# Patient Record
Sex: Female | Born: 1955 | Race: Black or African American | Hispanic: No | Marital: Married | Smoking: Never smoker
Health system: Southern US, Community
[De-identification: ages and names within clinical notes are randomized; demographics above are authoritative.]

## PROBLEM LIST (undated history)

## (undated) DIAGNOSIS — I890 Lymphedema, not elsewhere classified: Secondary | ICD-10-CM

## (undated) DIAGNOSIS — R1319 Other dysphagia: Secondary | ICD-10-CM

## (undated) DIAGNOSIS — E079 Disorder of thyroid, unspecified: Secondary | ICD-10-CM

## (undated) DIAGNOSIS — R1310 Dysphagia, unspecified: Secondary | ICD-10-CM

## (undated) HISTORY — DX: Disorder of thyroid, unspecified: E07.9

## (undated) HISTORY — DX: Other dysphagia: R13.19

## (undated) HISTORY — DX: Dysphagia, unspecified: R13.10

## (undated) HISTORY — DX: Lymphedema, not elsewhere classified: I89.0

---

## 1982-04-16 HISTORY — PX: ABDOMINAL HYSTERECTOMY: SHX81

## 2005-10-24 ENCOUNTER — Ambulatory Visit: Payer: Self-pay

## 2006-03-25 ENCOUNTER — Emergency Department: Payer: Self-pay | Admitting: Unknown Physician Specialty

## 2007-02-26 ENCOUNTER — Ambulatory Visit: Payer: Self-pay | Admitting: Internal Medicine

## 2008-03-08 ENCOUNTER — Ambulatory Visit: Payer: Self-pay | Admitting: Internal Medicine

## 2008-03-25 ENCOUNTER — Ambulatory Visit: Payer: Self-pay | Admitting: Internal Medicine

## 2008-04-10 ENCOUNTER — Emergency Department: Payer: Self-pay | Admitting: Emergency Medicine

## 2008-06-09 ENCOUNTER — Ambulatory Visit: Payer: Self-pay | Admitting: Internal Medicine

## 2008-06-11 ENCOUNTER — Ambulatory Visit: Payer: Self-pay | Admitting: Internal Medicine

## 2009-01-07 ENCOUNTER — Other Ambulatory Visit: Payer: Self-pay | Admitting: Internal Medicine

## 2009-03-08 ENCOUNTER — Other Ambulatory Visit: Payer: Self-pay | Admitting: Internal Medicine

## 2009-05-25 ENCOUNTER — Ambulatory Visit: Payer: Self-pay | Admitting: Internal Medicine

## 2009-06-07 ENCOUNTER — Other Ambulatory Visit: Payer: Self-pay | Admitting: Internal Medicine

## 2009-06-16 ENCOUNTER — Other Ambulatory Visit: Payer: Self-pay | Admitting: Internal Medicine

## 2009-12-02 ENCOUNTER — Other Ambulatory Visit: Payer: Self-pay | Admitting: Internal Medicine

## 2010-01-02 ENCOUNTER — Ambulatory Visit: Payer: Self-pay | Admitting: Internal Medicine

## 2010-01-31 ENCOUNTER — Other Ambulatory Visit: Payer: Self-pay | Admitting: Internal Medicine

## 2010-05-04 ENCOUNTER — Other Ambulatory Visit: Payer: Self-pay | Admitting: Internal Medicine

## 2010-08-20 LAB — HM MAMMOGRAPHY: HM Mammogram: NORMAL

## 2010-11-07 ENCOUNTER — Other Ambulatory Visit: Payer: Self-pay | Admitting: Internal Medicine

## 2011-01-15 LAB — TSH: TSH: 6.71 u[IU]/mL — AB (ref <=5.90)

## 2011-01-15 LAB — HEMOGLOBIN A1C: Hgb A1c MFr Bld: 6.1 % — AB (ref 4.0–6.0)

## 2011-01-22 LAB — LIPID PANEL: Cholesterol: 257 mg/dL — AB (ref 0–200)

## 2011-08-20 ENCOUNTER — Ambulatory Visit (INDEPENDENT_AMBULATORY_CARE_PROVIDER_SITE_OTHER): Payer: PRIVATE HEALTH INSURANCE | Admitting: Internal Medicine

## 2011-08-20 ENCOUNTER — Encounter: Payer: Self-pay | Admitting: Internal Medicine

## 2011-08-20 DIAGNOSIS — Z1239 Encounter for other screening for malignant neoplasm of breast: Secondary | ICD-10-CM

## 2011-08-20 DIAGNOSIS — I890 Lymphedema, not elsewhere classified: Secondary | ICD-10-CM | POA: Insufficient documentation

## 2011-08-20 DIAGNOSIS — R1314 Dysphagia, pharyngoesophageal phase: Secondary | ICD-10-CM

## 2011-08-20 DIAGNOSIS — R1310 Dysphagia, unspecified: Secondary | ICD-10-CM | POA: Insufficient documentation

## 2011-08-20 DIAGNOSIS — E042 Nontoxic multinodular goiter: Secondary | ICD-10-CM | POA: Insufficient documentation

## 2011-08-20 DIAGNOSIS — E785 Hyperlipidemia, unspecified: Secondary | ICD-10-CM

## 2011-08-20 NOTE — Patient Instructions (Signed)
Consider the Low Glycemic Index Diet and eat 6 smaller meals daily .  This boosts your metabolism and regulates your sugars:   7 AM Low carbohydrate Protein  Shakes (EAS Carb Control  Or Atkins ,  Available everywhere,   In  cases at [employer]Js )  2.5 carbs  (Add or substitute a toasted sandwhich thin w/ peanut butter)  10 AM: Protein bar by Atkins (snack size,  Chocolate lover's variety at  [employer])    Lunch: sandwich on pita bread or flatbread (Joseph's makes a pita bread and a flat bread , available at [employer] and [employer]; Toufayah makes a low carb flatbread available at [employer] and [employer]) Mission makes a low carb whole wheat tortilla available at [employer] most grocery stores   3 PM:  Mid day :  Another protein bar,  Or a  cheese stick, 1/4 cup of almonds, walnuts, pistachios, pecans, peanuts,  Macadamia nuts  6 PM  Dinner:  "mean and green:"  Meat/chicken/fish, salad, and green veggie : use ranch, vinagrette,  Blue cheese, etc  9 PM snack : Breyer's low carb fudgsicle or  ice cream bar (Carb Smart), or  Weight Watcher's ice cream bar , or another protein shake

## 2011-08-20 NOTE — Progress Notes (Signed)
Patient ID: Sandra Russo, female   DOB: February 28, 1956, 56 y.o.   MRN: [id number]  Patient Active Problem List  Diagnoses  . Acquired lymphedema of leg  . Dysphagia  . Multinodular goiter  . Esophageal dysphagia    Subjective:  CC:   Chief Complaint  Patient presents with  . Follow-up    HPI:   Sandra Russo a 56 y.o. female who presents for follow up on chronic conditions including dysphagia, hyperlipidemia, and palpitations.  She was last seen July 2012.  She has been having palpitations occasionally without presyncopal symptoms or shortness of breath.  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  It does not occur with exercise .  Her dysphagia has recurred for solid foods,  Helast Last esophageal dilation over 5 yrars ago.  She has had a recent evaluation by [facility] for lower leg edema secondary to lymphedema and has been ordered a lymphedema pump for right leg.    Oct labs hgba1c 6.1,  LDL 163, and tsh 6.710    Past Medical History  Diagnosis Date  . Acquired lymphedema of leg     right leg  . Dysphagia     treated with esophgeal dilation at [facility]  . multinodular goiter   . Esophageal dysphagia     History reviewed. No pertinent past surgical history.       The following portions of the patient's history were reviewed and updated as appropriate: Allergies, current medications, and problem list.    Review of Systems:   12 Pt  review of systems was negative except those addressed in the HPI,     History   Social History  . Marital Status: Married    Spouse Name: N/A    Number of Children: N/A  . Years of Education: N/A   Occupational History  . Not on file.   Social History Main Topics  . Smoking status: Never Smoker   . Smokeless tobacco: Never Used  . Alcohol Use: No  . Drug Use: No  . Sexually Active: Not on file   Other Topics Concern  . Not on file   Social History Narrative  . No narrative  on file    Objective:  BP 112/68  Pulse 75  Temp(Src) 97.8 F (36.6 C) (Oral)  Resp 14  Ht 5\' 6"  (1.676 m)  Wt 191 lb 12 oz (86.977 kg)  BMI 30.95 kg/m2  SpO2 98%  General appearance: alert, cooperative and appears stated age Ears: normal TM's and external ear canals both ears Throat: lips, mucosa, and tongue normal; teeth and gums normal Neck: no adenopathy, no carotid bruit, supple, symmetrical, trachea midline and thyroid not enlarged, symmetric, no tenderness/mass/nodules Back: symmetric, no curvature. ROM normal. No CVA tenderness. Lungs: clear to auscultation bilaterally Heart: regular rate and rhythm, S1, S2 normal, no murmur, click, rub or gallop Abdomen: soft, non-tender; bowel sounds normal; no masses,  no organomegaly Pulses: 2+ and symmetric Skin: Skin color, texture, turgor normal. No rashes or lesions Lymph nodes: Cervical, supraclavicular, and axillary nodes normal.  Assessment and Plan:  Multinodular goiter Her biopsy was reportedly normal, and her TSH was elevated in October.  Will repeat and treat if hypothyroid   Acquired lymphedema of leg To be managed with lymphedema pump.   Esophageal dysphagia She is experiencing dysphagia for solids , which is chronic   Will refer for EGD.     Updated Medication List No outpatient  encounter prescriptions on file as of 08/20/2011.     Orders Placed This Encounter  Procedures  . HM MAMMOGRAPHY  . MM Digital Screening  . HM PAP SMEAR  . Ambulatory referral to Gastroenterology  . HM COLONOSCOPY    No Follow-up on file.

## 2011-08-21 ENCOUNTER — Encounter: Payer: Self-pay | Admitting: Internal Medicine

## 2011-08-21 DIAGNOSIS — R1310 Dysphagia, unspecified: Secondary | ICD-10-CM | POA: Insufficient documentation

## 2011-08-21 DIAGNOSIS — E785 Hyperlipidemia, unspecified: Secondary | ICD-10-CM | POA: Insufficient documentation

## 2011-08-21 NOTE — Assessment & Plan Note (Signed)
Her biopsy was reportedly normal, and her TSH was elevated in October.  Will repeat and treat if hypothyroid

## 2011-08-21 NOTE — Assessment & Plan Note (Signed)
To be managed with lymphedema pump.

## 2011-08-21 NOTE — Assessment & Plan Note (Signed)
She is experiencing dysphagia for solids , which is chronic   Will refer for EGD.

## 2011-08-21 NOTE — Assessment & Plan Note (Signed)
Recent LDl was 162.   Will repeat with next blood draw and treat for LDL > 100.

## 2011-08-30 ENCOUNTER — Other Ambulatory Visit: Payer: Self-pay | Admitting: Internal Medicine

## 2011-08-30 LAB — TSH: Thyroid Stimulating Horm: 1.44 u[IU]/mL

## 2011-09-24 ENCOUNTER — Telehealth: Payer: Self-pay | Admitting: Internal Medicine

## 2011-09-24 NOTE — Telephone Encounter (Signed)
Patient wanting lab results from [facility] that she had done in May 2013.

## 2011-09-25 ENCOUNTER — Encounter: Payer: Self-pay | Admitting: Internal Medicine

## 2011-09-25 NOTE — Telephone Encounter (Signed)
Sent request to [facility] requesting labs.

## 2011-09-26 NOTE — Telephone Encounter (Signed)
We received the labs, they are in the green folder.  I will call when Dr. Darrick Huntsman has reviewed them.

## 2011-09-27 LAB — HM COLONOSCOPY

## 2011-10-03 ENCOUNTER — Ambulatory Visit: Payer: Self-pay | Admitting: Gastroenterology

## 2011-10-04 ENCOUNTER — Telehealth: Payer: Self-pay | Admitting: Internal Medicine

## 2011-10-04 NOTE — Telephone Encounter (Signed)
Patient called and stated she had labs done in May and has not heard the results yet.  Please advise.

## 2011-10-04 NOTE — Telephone Encounter (Signed)
All I received from [facility] was a hgba1c which was 6.2  This suggests that she has diet controlled diabetes.

## 2011-10-05 NOTE — Telephone Encounter (Signed)
I tried calling patient but she was not available at home or work.  I will try again later.

## 2011-10-08 ENCOUNTER — Ambulatory Visit: Payer: Self-pay | Admitting: Otolaryngology

## 2011-10-09 ENCOUNTER — Telehealth: Payer: Self-pay | Admitting: Internal Medicine

## 2011-10-09 NOTE — Telephone Encounter (Signed)
Pt canceled her mammogram   Sandra Russo 08/21/11

## 2011-10-09 NOTE — Telephone Encounter (Signed)
FYI

## 2011-10-10 NOTE — Telephone Encounter (Signed)
Patient notified

## 2011-11-22 ENCOUNTER — Encounter: Payer: Self-pay | Admitting: Internal Medicine

## 2012-01-15 HISTORY — PX: CHOLECYSTECTOMY: SHX55

## 2012-01-25 ENCOUNTER — Emergency Department: Payer: Self-pay | Admitting: Emergency Medicine

## 2012-01-25 LAB — COMPREHENSIVE METABOLIC PANEL
Albumin: 4.2 g/dL (ref 3.4–5.0)
Alkaline Phosphatase: 108 U/L (ref 50–136)
Anion Gap: 8 (ref 7–16)
BUN: 7 mg/dL (ref 7–18)
Chloride: 106 mmol/L (ref 98–107)
Co2: 28 mmol/L (ref 21–32)
Creatinine: 0.71 mg/dL (ref 0.60–1.30)
EGFR (Non-African Amer.): >60
Glucose: 142 mg/dL — ABNORMAL HIGH (ref 65–99)
Osmolality: 284 (ref 275–301)
Potassium: 4.2 mmol/L (ref 3.5–5.1)
SGPT (ALT): 26 U/L (ref 12–78)
Sodium: 142 mmol/L (ref 136–145)
Total Protein: 8.7 g/dL — ABNORMAL HIGH (ref 6.4–8.2)

## 2012-01-25 LAB — CBC
HCT: 39.1 % (ref 35.0–47.0)
HGB: 13 g/dL (ref 12.0–16.0)
MCH: 28.7 pg (ref 26.0–34.0)
MCHC: 33.3 g/dL (ref 32.0–36.0)
MCV: 86 fL (ref 80–100)

## 2012-01-25 LAB — URINALYSIS, COMPLETE
Bacteria: NONE SEEN
Bilirubin,UR: NEGATIVE
Leukocyte Esterase: NEGATIVE
Nitrite: NEGATIVE
Ph: 6 (ref 4.5–8.0)
Protein: NEGATIVE
RBC,UR: 1 /HPF (ref 0–5)
Specific Gravity: 1.011 (ref 1.003–1.030)
WBC UR: 1 /HPF (ref 0–5)

## 2012-01-28 ENCOUNTER — Telehealth: Payer: Self-pay | Admitting: Internal Medicine

## 2012-01-28 NOTE — Telephone Encounter (Signed)
Pt went to [facility] and was diagnosed with Gall Stones. Pt was given pain meds and nausea pills. She needs to discuss something's with Dr. Darrick Huntsman and nurse about FMLA papers and if she needs to be out of work before her surgery.

## 2012-01-28 NOTE — Telephone Encounter (Signed)
Caller: [name]/Patient; Patient Name: Sandra Russo; PCP: Duncan Dull (Adults only); Best Callback Phone Number: [phone]. Call regarding a FMLA form for Gallstones.  Pt was seen in ED on 01/25/12. Pt has an appt 02/06/12 with Dr. Michela Pitcher a surgeon. Pt taking Percocet for pain control and Zofran for nausea.  Pt wanting FMLA paper work filled out due to diagnosis.  Encouraged pt to have surgeon complete the paperwork. Pt concerned she may have another episode and have to miss work.  Pt not sure if she can sit for 12 hrs.  She continues to experience intermittment abdominal discomfort with mild - moderate pain.  All emergent symtpoms ruled out per Abdominal Pain Protocol with exception to 'All other situations' See Provider within 72 hrs'. Home care advice given.  No appt with PCP available 01/28/12.  Instructed pt to call scheduling for  future appt. Pt prefers a Thursday appt.

## 2012-01-30 ENCOUNTER — Emergency Department: Payer: Self-pay | Admitting: *Deleted

## 2012-01-30 LAB — CBC
HCT: 41.2 % (ref 35.0–47.0)
HGB: 13.6 g/dL (ref 12.0–16.0)
MCH: 28.5 pg (ref 26.0–34.0)
MCHC: 33 g/dL (ref 32.0–36.0)
RDW: 13.8 % (ref 11.5–14.5)

## 2012-01-30 LAB — COMPREHENSIVE METABOLIC PANEL
Albumin: 4.2 g/dL (ref 3.4–5.0)
Anion Gap: 8 (ref 7–16)
BUN: 7 mg/dL (ref 7–18)
Bilirubin,Total: 0.4 mg/dL (ref 0.2–1.0)
Co2: 28 mmol/L (ref 21–32)
Creatinine: 0.73 mg/dL (ref 0.60–1.30)
EGFR (African American): >60
EGFR (Non-African Amer.): >60
Glucose: 109 mg/dL — ABNORMAL HIGH (ref 65–99)
Osmolality: 276 (ref 275–301)
Total Protein: 9.1 g/dL — ABNORMAL HIGH (ref 6.4–8.2)

## 2012-01-31 ENCOUNTER — Ambulatory Visit: Payer: PRIVATE HEALTH INSURANCE | Admitting: Internal Medicine

## 2012-01-31 NOTE — Telephone Encounter (Signed)
Patient went back to ER last night after having another attack by her gallstones.  She isn't coming in today since she was told by ER to call surgeons office this a.m. and see if they could see her.  She said that she was trying to sit still so her gallbladder wouldn't act up again until she hears from them.

## 2012-02-04 ENCOUNTER — Ambulatory Visit: Payer: Self-pay | Admitting: Surgery

## 2012-04-14 ENCOUNTER — Ambulatory Visit: Payer: Self-pay | Admitting: Internal Medicine

## 2012-04-17 ENCOUNTER — Telehealth: Payer: Self-pay | Admitting: Internal Medicine

## 2012-04-17 DIAGNOSIS — R928 Other abnormal and inconclusive findings on diagnostic imaging of breast: Secondary | ICD-10-CM

## 2012-04-17 NOTE — Telephone Encounter (Signed)
Orders printed for diagnostic mammo and ultrasound. Please call [facility] to find out why they have not contacted patient yet.  Every other facility does.

## 2012-04-17 NOTE — Telephone Encounter (Signed)
I called patient to make sure that Sandra Russo had called her which she stated they had and she is aware of the appointment.

## 2012-04-17 NOTE — Telephone Encounter (Signed)
Her mammogram was incomplete  They need additional images of the left.  Has [facility] contacted her?

## 2012-04-17 NOTE — Telephone Encounter (Signed)
Called patient who stated that she has not received a call from [facility] yet.

## 2012-04-17 NOTE — Telephone Encounter (Signed)
I called and spoke with Selena Batten at [facility], she states that patient was contacted by Herbert Seta today around 3:43 patient is scheduled for 04/23/12 at 11:00.  Marchelle Folks is who follows up with patient at [facility].

## 2012-04-23 ENCOUNTER — Ambulatory Visit: Payer: Self-pay | Admitting: Internal Medicine

## 2012-04-23 LAB — HM MAMMOGRAPHY: HM Mammogram: NORMAL

## 2012-04-24 ENCOUNTER — Telehealth: Payer: Self-pay | Admitting: Internal Medicine

## 2012-04-24 NOTE — Telephone Encounter (Signed)
Additional views of the left breast were normal. Continued annual followup.

## 2012-04-25 NOTE — Telephone Encounter (Signed)
Pt notified of the results.  

## 2012-05-06 ENCOUNTER — Encounter: Payer: Self-pay | Admitting: Internal Medicine

## 2012-05-07 ENCOUNTER — Encounter: Payer: Self-pay | Admitting: Internal Medicine

## 2012-05-16 ENCOUNTER — Ambulatory Visit (INDEPENDENT_AMBULATORY_CARE_PROVIDER_SITE_OTHER): Payer: 59 | Admitting: Internal Medicine

## 2012-05-16 ENCOUNTER — Encounter: Payer: Self-pay | Admitting: Internal Medicine

## 2012-05-16 VITALS — BP 140/82 | HR 78 | Temp 97.9°F | Resp 17 | Wt 181.2 lb

## 2012-05-16 DIAGNOSIS — J189 Pneumonia, unspecified organism: Secondary | ICD-10-CM

## 2012-05-16 DIAGNOSIS — J1100 Influenza due to unidentified influenza virus with unspecified type of pneumonia: Secondary | ICD-10-CM

## 2012-05-16 LAB — POCT INFLUENZA A/B: Influenza B, POC: NEGATIVE

## 2012-05-16 MED ORDER — HYDROCODONE-HOMATROPINE 5-1.5 MG/5ML PO SYRP: 5.0000 mL | ORAL_SOLUTION | Freq: Four times a day (QID) | ORAL | Status: DC | PRN

## 2012-05-16 MED ORDER — PREDNISONE (PAK) 10 MG PO TABS: ORAL_TABLET | ORAL | Status: DC

## 2012-05-16 MED ORDER — ALBUTEROL SULFATE HFA 108 (90 BASE) MCG/ACT IN AERS: 2.0000 | INHALATION_SPRAY | Freq: Four times a day (QID) | RESPIRATORY_TRACT | Status: DC | PRN

## 2012-05-16 MED ORDER — ALBUTEROL SULFATE (2.5 MG/3ML) 0.083% IN NEBU
2.5000 mg | INHALATION_SOLUTION | Freq: Once | RESPIRATORY_TRACT | Status: AC
  Administered 2012-05-16: 2.5 mg via RESPIRATORY_TRACT

## 2012-05-16 MED ORDER — METHYLPREDNISOLONE ACETATE 40 MG/ML IJ SUSP
40.0000 mg | Freq: Once | INTRAMUSCULAR | Status: AC
  Administered 2012-05-16: 40 mg via INTRAMUSCULAR

## 2012-05-16 MED ORDER — LEVOFLOXACIN 500 MG PO TABS: 500.0000 mg | ORAL_TABLET | Freq: Every day | ORAL | Status: DC

## 2012-05-16 NOTE — Progress Notes (Signed)
Patient ID: Sandra Russo, female   DOB: 1955-06-10, 57 y.o.   MRN: [id number]   Patient Active Problem List  Diagnosis  . Acquired lymphedema of leg  . Dysphagia  . Multinodular goiter  . Esophageal dysphagia  . Other and unspecified hyperlipidemia  . Influenza and pneumonia    Subjective:  CC:   Chief Complaint  Patient presents with  . Cough    HPI:   Sandra Russo a 57 y.o. female who presents Fever chills,  Started a week ago  Felt like she had the flu .  Had the vaccine September.  Diarrhea started mon and tuesday but has resolved. ,  Now having a  productive cough  And bloody sinus drainage.  Feels lousy but went to work last night.  Was out Fri Mon and Tuesday .     Past Medical History  Diagnosis Date  . Acquired lymphedema of leg     right leg  . Dysphagia     treated with esophgeal dilation at [facility]  . multinodular goiter   . Esophageal dysphagia     Past Surgical History  Procedure Date  . Cholecystectomy Oct 2013    [facility]         The following portions of the patient's history were reviewed and updated as appropriate: Allergies, current medications, and problem list.    Review of Systems:   Patient denies headache, unintentional weight loss, skin rash, eye pain, sinus congestion and sinus pain, sore throat, dysphagia,  hemoptysis ,chest pain, palpitations, orthopnea, edema, abdominal pain, nausea, melena, diarrhea, constipation, flank pain, dysuria, hematuria, urinary  Frequency, nocturia, numbness, tingling, seizures,  Focal weakness, Loss of consciousness,  Tremor, insomnia, depression, anxiety, and suicidal ideation.         History   Social History  . Marital Status: Married    Spouse Name: N/A    Number of Children: N/A  . Years of Education: N/A   Occupational History  . Not on file.   Social History Main Topics  . Smoking status: Never Smoker   . Smokeless tobacco: Never Used  . Alcohol Use: No  . Drug Use: No  . Sexually  Active: Not on file   Other Topics Concern  . Not on file   Social History Narrative  . No narrative on file    Objective:  BP 140/82  Pulse 78  Temp 97.9 F (36.6 C) (Oral)  Resp 17  Wt 181 lb 4 oz (82.214 kg)  SpO2 96%  General appearance: alert, cooperative and appears stated age Ears: normal TM's and external ear canals both ears Throat: lips, mucosa, and tongue normal; teeth and gums normal Neck: no adenopathy, no carotid bruit, supple, symmetrical, trachea midline and thyroid not enlarged, symmetric, no tenderness/mass/nodules Back: symmetric, no curvature. ROM normal. No CVA tenderness. Lungs: abnormal, with ronchi, squeaks, increased fremitus  Wheezing  Heart: regular rate and rhythm, S1, S2 normal, no murmur, click, rub or gallop Abdomen: soft, non-tender; bowel sounds normal; no masses,  no organomegaly Pulses: 2+ and symmetric Skin: Skin color, texture, turgor normal. No rashes or lesions Lymph nodes: Cervical, supraclavicular, and axillary nodes normal.  Assessment and Plan:  Influenza and pneumonia By history, now with abnormal lung exam notable for ronchi, wheezing and increased tactile fremitus. .  Empiric treatment with prednisone  and levaquin, bronchodilators and cough suppressant for nocturnal use.  ,  cxr ordered.    Updated Medication List Outpatient Encounter Prescriptions as of 05/16/2012  Medication Sig  Dispense Refill  . albuterol (PROVENTIL HFA;VENTOLIN HFA) 108 (90 BASE) MCG/ACT inhaler Inhale 2 puffs into the lungs every 6 (six) hours as needed for wheezing.  1 Inhaler  0  . HYDROcodone-homatropine (HYCODAN) 5-1.5 MG/5ML syrup Take 5 mLs by mouth every 6 (six) hours as needed for cough.  180 mL  0  . levofloxacin (LEVAQUIN) 500 MG tablet Take 1 tablet (500 mg total) by mouth daily.  7 tablet  0  . predniSONE (STERAPRED UNI-PAK) 10 MG tablet 6 tablets on day 1, decrease by tablet daily until gone  21 tablet  0  . albuterol (PROVENTIL) (2.5 MG/3ML)  0.083% nebulizer solution 2.5 mg       . [EXPIRED] methylPREDNISolone acetate (DEPO-MEDROL) injection 40 mg          Orders Placed This Encounter  Procedures  . DG Chest 2 View  . HM MAMMOGRAPHY  . POCT Influenza A/B  . HM COLONOSCOPY    No Follow-up on file.

## 2012-05-16 NOTE — Patient Instructions (Addendum)
I am treating you for pneumonia and asthma. Please get a chest xray early next week if not today   Please start the levaquin tonight and the prednisone in the morning  Use the albuterol inhaler 2 puffs every 6 hours if needed for chest tightness or wheezing  The cough syrup has hydrocodone in it and may make you drowsy.  You can use Delsym for daytime cough

## 2012-05-18 ENCOUNTER — Encounter: Payer: Self-pay | Admitting: Internal Medicine

## 2012-05-18 DIAGNOSIS — J1100 Influenza due to unidentified influenza virus with unspecified type of pneumonia: Secondary | ICD-10-CM | POA: Insufficient documentation

## 2012-05-18 NOTE — Assessment & Plan Note (Addendum)
By history, now with abnormal lung exam notable for ronchi, wheezing and increased tactile fremitus. .  Empiric treatment with prednisone  and levaquin, bronchodilators and cough suppressant for nocturnal use.  ,  cxr ordered.

## 2012-05-19 ENCOUNTER — Ambulatory Visit: Payer: Self-pay | Admitting: Internal Medicine

## 2012-05-20 ENCOUNTER — Telehealth: Payer: Self-pay | Admitting: General Practice

## 2012-05-20 ENCOUNTER — Other Ambulatory Visit: Payer: Self-pay | Admitting: General Practice

## 2012-05-20 NOTE — Telephone Encounter (Signed)
Pt called stating that the levaquin prescribed to her is making her severely nauseaous. Please advise.

## 2012-05-20 NOTE — Telephone Encounter (Signed)
Called patient and she stated she did not get in 5 doses, she was only able to get in about 3. Not having the fever or chills but still having the discolored sputum and congestion. Therefore abx was called in to [facility]

## 2012-05-20 NOTE — Telephone Encounter (Signed)
If she got at least 5 doses in,  She probably won't need any additional antibiotics.  But if she didn't ,or if she is still having discolored sputum or fevers/chills ,  I will authoriize azithromycin 500 mg daily for 5 days

## 2012-05-21 ENCOUNTER — Telehealth: Payer: Self-pay | Admitting: Internal Medicine

## 2012-05-21 NOTE — Telephone Encounter (Signed)
Pt.notified

## 2012-05-21 NOTE — Telephone Encounter (Signed)
Chest x ray suggests she didn't take a deep breath but could also be a early pneumonia in RLL.  contineu abx

## 2012-05-22 ENCOUNTER — Telehealth: Payer: Self-pay | Admitting: General Practice

## 2012-05-22 NOTE — Telephone Encounter (Signed)
Pt called wanting to know if she can have a letter stating she is able to go back to work on Monday. Please advise.

## 2012-05-22 NOTE — Telephone Encounter (Signed)
letter on printer  

## 2012-05-31 ENCOUNTER — Other Ambulatory Visit: Payer: Self-pay

## 2012-06-09 ENCOUNTER — Encounter: Payer: Self-pay | Admitting: Internal Medicine

## 2012-09-17 ENCOUNTER — Ambulatory Visit (INDEPENDENT_AMBULATORY_CARE_PROVIDER_SITE_OTHER): Payer: 59 | Admitting: Adult Health

## 2012-09-17 ENCOUNTER — Encounter: Payer: Self-pay | Admitting: Adult Health

## 2012-09-17 VITALS — BP 110/62 | HR 65 | Temp 97.6°F | Resp 12 | Wt 182.5 lb

## 2012-09-17 DIAGNOSIS — R82998 Other abnormal findings in urine: Secondary | ICD-10-CM

## 2012-09-17 DIAGNOSIS — L748 Other eccrine sweat disorders: Secondary | ICD-10-CM

## 2012-09-17 DIAGNOSIS — L02439 Carbuncle of limb, unspecified: Secondary | ICD-10-CM

## 2012-09-17 DIAGNOSIS — R8290 Unspecified abnormal findings in urine: Secondary | ICD-10-CM | POA: Insufficient documentation

## 2012-09-17 DIAGNOSIS — L02429 Furuncle of limb, unspecified: Secondary | ICD-10-CM

## 2012-09-17 DIAGNOSIS — L750 Bromhidrosis: Secondary | ICD-10-CM

## 2012-09-17 LAB — POCT URINALYSIS DIPSTICK
Blood, UA: NEGATIVE
Glucose, UA: NEGATIVE
Nitrite, UA: POSITIVE
Protein, UA: NEGATIVE
Spec Grav, UA: 1.02
Urobilinogen, UA: 0.2

## 2012-09-17 MED ORDER — DOXYCYCLINE HYCLATE 100 MG PO TABS: 100.0000 mg | ORAL_TABLET | Freq: Two times a day (BID) | ORAL | Status: DC

## 2012-09-17 MED ORDER — CIPROFLOXACIN HCL 250 MG PO TABS: 250.0000 mg | ORAL_TABLET | Freq: Two times a day (BID) | ORAL | Status: DC

## 2012-09-17 NOTE — Assessment & Plan Note (Signed)
UA dipstick positive for leukocytes. Send urine for culture. Start Cipro 250 mg twice a day x3 days. RTC if symptoms are not improved within 3-4 days.

## 2012-09-17 NOTE — Progress Notes (Signed)
  Subjective:    Patient ID: Sandra Russo, female    DOB: 09/06/1955, 57 y.o.   MRN: [id number]  HPI  Pt presents to clinic with c/o strong odor in urine. Pt reports drinking plenty of fluids. She has been taking cranberry juice. She has also noticed "pressure". No discharge, itchiness or burning. She first noticed this strong odor ~ 1 week ago.  Patient has not consumed any new foods such as asparagus that would change the odor of the urine. 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 She reports sitting for extended periods of time with difficulty getting to the bathroom due to to lack of coverage.  She also presents with a boil under her right arm. She noticed it being sore ~ 1 week ago. She has applying Neosporin to the area. She reports slight tenderness to touch. However, if she leaves the area alone, she does not feel any discomfort. There is no drainage from the site. She denies any fever.   Current Outpatient Prescriptions on File Prior to Visit  Medication Sig Dispense Refill  . albuterol (PROVENTIL HFA;VENTOLIN HFA) 108 (90 BASE) MCG/ACT inhaler Inhale 2 puffs into the lungs every 6 (six) hours as needed for wheezing.  1 Inhaler  0   No current facility-administered medications on file prior to visit.     Review of Systems  Constitutional: Negative for fever and chills.  Genitourinary: Negative for dysuria, urgency, frequency and hematuria.       Strong odor to urine  Skin:       Boil under her right arm; tender to touch, no drainage      BP 110/62  Pulse 65  Temp(Src) 97.6 F (36.4 C) (Oral)  Resp 12  Wt 182 lb 8 oz (82.781 kg)  BMI 29.47 kg/m2  SpO2 99%    Objective:   Physical Exam  Constitutional: She is oriented to person, place, and time. She appears well-developed and well-nourished. No distress.  Cardiovascular: Normal rate.   Pulmonary/Chest: Effort normal. No respiratory distress.  Neurological: She is alert and  oriented to person, place, and time.  Skin: Skin is warm and dry.  Furuncle underneath right arm. Slight erythema. No drainage noted.  Psychiatric: She has a normal mood and affect. Her behavior is normal. Judgment and thought content normal.          Assessment & Plan:

## 2012-09-17 NOTE — Patient Instructions (Addendum)
  For your UTI:  Start Cipro twice daily for 3 days. Continue to drink plenty of water, fluids.  Empty your bladder when you have the urge. Do not hold for extended periods of time.   For your boil:  Continue to apply Neosporin to the affected area under your right arm.  If the area becomes larger, more uncomfortable or if you develop a fever please start the Doxycycline. If not, do not fill this medication.

## 2012-09-17 NOTE — Assessment & Plan Note (Signed)
Continue to apply Neosporin to the area. Doxycycline prescription provided in the event area becomes larger, more tender or if patient develops fever.

## 2013-02-19 ENCOUNTER — Other Ambulatory Visit: Payer: Self-pay

## 2013-04-23 ENCOUNTER — Ambulatory Visit: Payer: Self-pay | Admitting: Internal Medicine

## 2013-05-15 ENCOUNTER — Encounter: Payer: Self-pay | Admitting: Internal Medicine

## 2014-08-03 NOTE — Op Note (Signed)
Sandra Russo:  Sandra Russo, Sandra Russo MR#:  [id number] DATE OF BIRTH:  07-Aug-1955  DATE OF PROCEDURE:  02/04/2012  PREOPERATIVE DIAGNOSIS: Symptomatic cholelithiasis.   POSTOPERATIVE DIAGNOSIS: Acute cholecystitis.   PROCEDURE PERFORMED: Laparoscopic cholecystectomy.   SURGEON: Hyde Sires A. Tyreese Thain, MD  ANESTHESIA: General.   SPECIMENS: Gallbladder.   ESTIMATED BLOOD LOSS: 50 mL.   COMPLICATIONS: None.   INDICATION FOR SURGERY: Sandra Russo is a pleasant 59 year old female who presented last week with right upper quadrant pain which waxed and waned. She was noted to have gallstones on ultrasound and decision was made to undergo laparoscopic cholecystectomy.   DETAILS OF PROCEDURE: Appropriate consent was obtained. She was brought to the Operating Room suite and laid supine on the Operating Room table. Her abdomen was then prepped and draped in standard surgical fashion. A time out was then performed correctly identifying Sandra Russo, operative site and procedure to be performed. A transverse supraumbilical incision was made. This was deepened down to the fascia. the fascia was grasped. The fascia was incised. A finger was placed through the fascia and there were no adhesions to the underlying surface. 2-0 Vicryl stay sutures were placed through the fascia. Hassan trocar was placed in the abdomen. The abdomen was insufflated. The Sandra was rotated reverse Trendelenburg and to the left. The gallbladder was visualized. It was noted to be adherent to the surrounding omentum and have a bit of a rind on it. The epigastric 11 mm port was then placed under direct visualization and two 5 mm ports approximately 2 cm below the costal margin were placed at the anterior axillary line and the mid clavicular line. The gallbladder was grasped by the fundus. It was retracted over the dome of the liver. Adhesions were taken down off the gallbladder. The cystic artery and cystic duct were then dissected out. Critical  view was obtained. The gallbladder was then taken off the gallbladder fossa. 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 I did apply electrocautery and also Surgicel and at the end of the case this was noted to be hemostatic. Gallbladder was then taken off the gallbladder fossa. There was a poor plane but it came out. There was a little bit of posterior wall which remained attached to the fossa which was cauterized to prevent a biloma. Gallbladder was then taken out with a endocatch bag through the supraumbilical port. The abdomen was then irrigated. The gallbladder fossa was examined. It was hemostatic. The trocars were then removed under direct visualization. The supraumbilical trocar was closed using a figure-of-eight 0 Vicryl as well as tying the previously placed stay sutures together. The skin was closed using interrupted 4-0 Monocryl dermals. Dermabond was then placed over the wounds. Sandra was then awoken, extubated and brought to postanesthesia care unit. There were no immediate complications. Needle, sponge, and instrument counts correct at the end of the procedure.    ____________________________ Si Raiderhristopher A. Amol Domanski, MD cal:cms D: 02/04/2012 15:31:24 ET T: 02/04/2012 15:59:29 ET JOB#: [id number]  cc: Cristal Deerhristopher A. Pretty Weltman, MD, <Dictator> Jarvis NewcomerHRISTOPHER A Rianne Degraaf MD ELECTRONICALLY SIGNED 02/05/2012 9:05

## 2014-08-30 ENCOUNTER — Encounter: Payer: Self-pay | Admitting: Internal Medicine

## 2014-08-30 ENCOUNTER — Ambulatory Visit
Admission: RE | Admit: 2014-08-30 | Discharge: 2014-08-30 | Disposition: A | Discharge status: Home / Self Care | Payer: 59 | Source: Ambulatory Visit | Attending: Internal Medicine | Admitting: Internal Medicine

## 2014-08-30 ENCOUNTER — Ambulatory Visit (INDEPENDENT_AMBULATORY_CARE_PROVIDER_SITE_OTHER): Payer: 59 | Admitting: Internal Medicine

## 2014-08-30 VITALS — BP 130/80 | HR 77 | Temp 98.1°F | Resp 14 | Ht 64.0 in | Wt 195.5 lb

## 2014-08-30 DIAGNOSIS — R0789 Other chest pain: Secondary | ICD-10-CM | POA: Insufficient documentation

## 2014-08-30 DIAGNOSIS — E042 Nontoxic multinodular goiter: Secondary | ICD-10-CM

## 2014-08-30 DIAGNOSIS — K589 Irritable bowel syndrome without diarrhea: Secondary | ICD-10-CM

## 2014-08-30 DIAGNOSIS — R079 Chest pain, unspecified: Secondary | ICD-10-CM | POA: Insufficient documentation

## 2014-08-30 DIAGNOSIS — E785 Hyperlipidemia, unspecified: Secondary | ICD-10-CM

## 2014-08-30 DIAGNOSIS — R1314 Dysphagia, pharyngoesophageal phase: Secondary | ICD-10-CM

## 2014-08-30 DIAGNOSIS — R1012 Left upper quadrant pain: Secondary | ICD-10-CM

## 2014-08-30 DIAGNOSIS — E669 Obesity, unspecified: Secondary | ICD-10-CM

## 2014-08-30 DIAGNOSIS — R1310 Dysphagia, unspecified: Secondary | ICD-10-CM

## 2014-08-30 DIAGNOSIS — R1319 Other dysphagia: Secondary | ICD-10-CM

## 2014-08-30 LAB — COMPREHENSIVE METABOLIC PANEL
ALK PHOS: 86 U/L (ref 39–117)
ALT: 16 U/L (ref 0–35)
AST: 16 U/L (ref 0–37)
Albumin: 3.8 g/dL (ref 3.5–5.2)
BUN: 8 mg/dL (ref 6–23)
CO2: 30 mEq/L (ref 19–32)
CREATININE: 0.84 mg/dL (ref 0.40–1.20)
Calcium: 9.6 mg/dL (ref 8.4–10.5)
Chloride: 106 mEq/L (ref 96–112)
GFR: 89.39 mL/min (ref >60.00)
Glucose, Bld: 100 mg/dL — ABNORMAL HIGH (ref 70–99)
POTASSIUM: 3.8 meq/L (ref 3.5–5.1)
Sodium: 139 mEq/L (ref 135–145)
Total Bilirubin: 0.4 mg/dL (ref 0.2–1.2)
Total Protein: 7.2 g/dL (ref 6.0–8.3)

## 2014-08-30 LAB — LIPID PANEL
CHOLESTEROL: 248 mg/dL — AB (ref 0–200)
HDL: 57.4 mg/dL (ref >39.00)
LDL Cholesterol: 170 mg/dL — ABNORMAL HIGH (ref 0–99)
NonHDL: 190.6
TRIGLYCERIDES: 104 mg/dL (ref 0.0–149.0)
Total CHOL/HDL Ratio: 4
VLDL: 20.8 mg/dL (ref 0.0–40.0)

## 2014-08-30 LAB — CBC WITH DIFFERENTIAL/PLATELET
BASOS ABS: 0 10*3/uL (ref 0.0–0.1)
Basophils Relative: 0.3 % (ref 0.0–3.0)
EOS ABS: 0.1 10*3/uL (ref 0.0–0.7)
Eosinophils Relative: 2.1 % (ref 0.0–5.0)
HEMATOCRIT: 38.5 % (ref 36.0–46.0)
Hemoglobin: 12.9 g/dL (ref 12.0–15.0)
LYMPHS ABS: 2.8 10*3/uL (ref 0.7–4.0)
Lymphocytes Relative: 39.7 % (ref 12.0–46.0)
MCHC: 33.6 g/dL (ref 30.0–36.0)
MCV: 84.8 fl (ref 78.0–100.0)
MONOS PCT: 5.6 % (ref 3.0–12.0)
Monocytes Absolute: 0.4 10*3/uL (ref 0.1–1.0)
NEUTROS ABS: 3.7 10*3/uL (ref 1.4–7.7)
Neutrophils Relative %: 52.3 % (ref 43.0–77.0)
Platelets: 325 10*3/uL (ref 150.0–400.0)
RBC: 4.54 Mil/uL (ref 3.87–5.11)
RDW: 14.3 % (ref 11.5–15.5)
WBC: 7.1 10*3/uL (ref 4.0–10.5)

## 2014-08-30 LAB — LIPASE: Lipase: 25 U/L (ref 11.0–59.0)

## 2014-08-30 LAB — TSH: TSH: 2.31 u[IU]/mL (ref 0.35–4.50)

## 2014-08-30 NOTE — Patient Instructions (Signed)
We are postponing your annual wellness exam due to your acute problems.  I have ordered a chest x ray to evaluate your aorta and heart .  Please get this done at [facility] .  This is Dr. Melina Schoolsullo's version of a  "Low GI"  Weight loss Diet.  It is appropriate for all patients with normal renal function , gluten tolerance, and advised for patients who have prediabetes or diabetes:   All of the foods can be found at grocery stores and in bulk at [employer].  The Atkins protein bars and shakes are available in more varieties at Target, WalMart and [employer]'s [employer].     7 AM Breakfast:  Choose from the following:  < 5 carbs  Weekdays: Low carbohydrate Protein  Shakes (EAS AdvantEdge "Carb  Control" shakes, Atkins,  Muscle Milk or Premier Protein shakes)     Weekends:  a scrambled egg/bacon/cheese burrito made with Mission's "carb  balance" whole wheat tortilla  (about 10 net carbs )  Eggs,  bacon /sausage , Joseph's pita /lavash bread or  (5 carbs)  A slice of fritatta ( egg based baked dish, no  crust:  google it) (< 10 carbs)   Avoid cereal and bananas, oatmeal and cream of wheat and grits. They are loaded with carbohydrates!   10 AM: high protein snack  (< 5 carbs)   Protein bar by Atkins  Or KIND  (the snack size, < 200 cal, usually < 6 carbs    A stick of cheese:  Around 1 carb,  100 cal      Other so called "protein bars" tend to be loaded with carbohydrates.  Remember, in food advertising, the word "energy" is synonymous for " carbohydrate."  Lunch:   A Sandwich using the bread choices listed, Can use any  Eggs,  lunchmeat, grilled meat or canned tuna).  Can add avocado, regular mayo/mustard  and cheese.  A Salad using blue cheese, ranch,  Goddess dressing  or vinagrette,  No croutons or "confetti" and no "candied nuts" but regular nuts OK.   2 HARD BOILED EGG WHITES AND A CUP OF one of these greek yogurts:    dannon lt n fit greek yogurt         chobani 100 greek yogurt,    Oikos triple zero greek  yogurt       No pretzels or chips.  Pickles and miniature sweet peppers are a good low  carb alternative that provide a "crunch"  The bread is the only source of carbohydrate in a sandwich and  can be decreased by trying some of these alternatives to traditional loaf bread:   Joseph's pita bread and Lavash (flat) bread :  50 cal and 4 net carbs  available at BJs and WalMart.  Taste better when toasted, use as pita chips  Toufayan makes a variety of  flatbreads and  A PITA POCKET.    LOOK FOR  THE ONES THAT ARE 17 NET CARBS OR LESS    Mission makes 2 sizes of  Low carb whole wheat tortillas  (The large one is  210 cal and 6 net carbs)   Avoid "Low fat dressings, as well as Reyne Dumas[place]ina and [address] dressings    3 PM/ Mid day  Snack:  Consider  1 ounce of  almonds, walnuts, pistachios, pecans, peanuts,  Macadamia nuts or a nut medley that does not contain raisins or cranberries.  No "granola"; the dried cranberries and raisins are loaded  with carbohydrates. Mixed nuts as long as there are no raisins,  cranberries or dried fruit.    Try the prosciutto/mozzarella cheese sticks by [employer]  In deli /backery section   High protein   To avoid overindulging in snacks: Try drinking a glass of unsweeted almond/coconut milk  Or a cup of coffee with your Atkins chocolate bar to keep you from having 3!!!   Pork rinds!  Yes Pork Rinds are low carb potato chip substitute!   Toasted Joseph's flatbread with hummous dip (chickpeas)       6 PM  Dinner:     Meat/fowl/fish with a green salad, and either broccoli, cauliflower, green beans, spinach, brussel sprouts, bok choy or  Lima beans. Fried in canola oil /olive oil BUT DO NOT BREAD THE PROTEIN!!      There is a low carb pasta by Dreamfield's that is acceptable and tastes great: only 5 digestible carbs/serving.( All grocery stores but BJs carry it )  Prepared Meals:  Try Kai LevinsMichel Angelo's chicken piccata or chicken or eggplant parm over low carb pasta.(Lowes  and BJs)   Clifton CustardAaron Sanchez's "Carnitas" (pulled pork, no sauce,  0 carbs) or his beef pot roast to make a dinner burrito (at [employer])  Barbecue with cole slaw is low carb BUT NO BUN!  SAME WITH HAMBURGERS     Whole wheat pasta is still full of digestible carbs and  Not as low in glycemic index as Dreamfield's.   Brown rice is still rice,  So skip the rice and noodles if you eat Congohinese or [place] (or at least limit to 1/2 cup)  9 PM snack :   Breyer's "low carb" fudgsicle or  ice cream bar (Carb Smart line), or  Weight  Watcher's ice cream bar , or another "no sugar added" ice cream;  a serving of fresh berries/cherries with whipped cream   Cheese or greek yogurt   8 ounces of [place] Diamond unsweetened almond/cococunut milk  Cheese and crackers (using WASA crackers,  They are low carb) or peanut butter on low carb crackers or pita bread     Avoid bananas, pineapple, grapes  and watermelon on a regular basis because they are high in sugar.  THINK OF THEM AS DESSERT and do not have daily   Remember that snack Substitutions should be less than 10 NET carbs per serving and meals should be < 20 net carbs. Remember that carbohydrates from fiber do not affect blood sugar, so you can  subtract fiber grams to get the "net carbs " of any particular food item.

## 2014-08-30 NOTE — Progress Notes (Signed)
Patient ID: Sandra Russo, female   DOB: [birth year], 59 y.o.   MRN: [id number]  Patient Active Problem List   Diagnosis Date Noted  . IBS (irritable colon syndrome) 08/31/2014  . Obesity 08/30/2014  . Chest pain 08/30/2014  . Hyperlipidemia with target LDL less than 160 08/21/2011  . Esophageal dysphagia   . Multinodular goiter 08/20/2011  . Acquired lymphedema of leg     Subjective:  CC:   Chief Complaint  Patient presents with  . Annual Exam    HPI:   Sandra Russo is a 59 y.o. female who presents for    Annual exam, but patient has not been seen since Feb 2014 and has several medical issues to discuss. So annual wellness Exam postponed   1)_ Pain when she presses on her LUQ or leans over a counter and presses on it,  Pain shoots to right arm and is severe, to the bone, happens every time she leans over onto left upper quadtrant,  Has been occurring for nearly a year,  And the pain has become more severe.  Exam of left arm today is normal  2) Had an episode of severe chest pain and irregular heat beat lasting two hours 3 weeks ago .  Occurred when she got up suddenly from sleeping after being up for more than 24 hours. Pain radiated to her left  jaw , was not accompanied by nausea or shortness of breath.  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  Works 3 shifts per week,  Head feels foggy in the morning when she first wakes, up,  Takes an hour to two to clear head.  Not sure if she snores, Wakes up groggy and tired too,  Not sure if she snores,  Husband snores. Marland Kitchen      6) Weight gain  :  No regular exercise,  Has tried cutting back  on food,  Thinks it may be constipation, sinc she has it along with bloating   Her bowel evacuation pattern is to have several days of constipation followed by a prolonged defecation period, after using an organic vinegar  That she drinks  And works in several hours,  Does this weekly.  History of IBS,  Last colonoscopy attempted in 2013 by Niel Hummer but reports says prep was poor so incomplete.  Wt Readings from Last 3 Encounters:  08/30/14 195 lb 8 oz (88.678 kg)  09/17/12 182 lb 8 oz (82.781 kg)  05/16/12 181 lb 4 oz (82.214 kg)    . 7) Dysphagia, chronic, History of esophageal stricture..  Prior dilations  Last one done and was normal in 2013.  Not currently  using a PPI,  Still feels like she has a lump in her throat, which was investigated  Two year ago with a normal exam by ENT .  Barium swallow was ordered but never done .      Past Medical History  Diagnosis Date  . Acquired lymphedema of leg     right leg  . Dysphagia     treated with esophgeal dilation at [facility]  .  multinodular goiter   . Esophageal dysphagia     Past Surgical History  Procedure Laterality Date  . Cholecystectomy  Oct 2013    [facility]  . Abdominal hysterectomy  1984       The following portions of the patient's history were reviewed and updated as appropriate: Allergies, current medications, and problem list.    Review of Systems:   Patient denies headache, fevers, malaise, unintentional weight loss, skin rash, eye pain, sinus congestion and sinus pain, sore throat, dysphagia,  hemoptysis , cough, dyspnea, wheezing, chest pain, palpitations, orthopnea, edema, abdominal pain, nausea, melena, diarrhea, constipation, flank pain, dysuria, hematuria, urinary  Frequency, nocturia, numbness, tingling, seizures,  Focal weakness, Loss of consciousness,  Tremor, insomnia, depression, anxiety, and suicidal ideation.     History   Social History  . Marital Status: Married    Spouse Name: N/A  . Number of  Children: N/A  . Years of Education: N/A   Occupational History  . Not on file.   Social History Main Topics  . Smoking status: Never Smoker   . Smokeless tobacco: Never Used  . Alcohol Use: No  . Drug Use: No  . Sexual Activity: Not on file   Other Topics Concern  . Not on file   Social History Narrative    Objective:  Filed Vitals:   08/30/14 1326  BP: 130/80  Pulse: 77  Temp: 98.1 F (36.7 C)  Resp: 14     General appearance: alert, cooperative and appears stated age Ears: normal TM's and external ear canals both ears Throat: lips, mucosa, and tongue normal; teeth and gums normal Neck: no adenopathy, no carotid bruit, supple, symmetrical, trachea midline and thyroid not enlarged, symmetric, no tenderness/mass/nodules Back: symmetric, no curvature. ROM normal. No CVA tenderness. Lungs: clear to auscultation bilaterally Heart: regular rate and rhythm, S1, S2 normal, no murmur, click, rub or gallop Abdomen: soft, non-tender; bowel sounds normal; no masses,  no organomegaly Pulses: 2+ and symmetric Skin: Skin color, texture, turgor normal. No rashes or lesions Lymph nodes: Cervical, supraclavicular, and axillary nodes normal.  Assessment and Plan:  Hyperlipidemia with target LDL less than 160  Based on  current lipid profile, the risk of clinically significant CAD is 8% over the next 10 years, using the Framingham risk calculator. 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  EKG is normal today  And Her chest x ray is clear .  Given that there are no  signs of prior MRI or arrhythmia on EKG done in office No  further workup at this time per patient preference.   Advised to resume PPI and stop drinking [facility].    Dysphagia She continues to report a  is experiencing dysphagia for solids , which is chronic and has been worked up with both EGD and ENT evaluation .  Sh ehas not had any choking or regurgitation,  More of a globus sensation.     Obesity She has gained 14 lbs in the last 2 years and Body mass index is 33.54 kg/(m^2).   I have addressed  BMI and recommended a low glycemic index diet utilizing smaller  more frequent meals to increase metabolism.  I have also recommended that patient start exercising with a goal of 30 minutes of aerobic exercise a minimum of 5 days per week. Screening for lipid disorders, thyroid and diabetes were  done today.      Esophageal dysphagia She is experiencing dysphagia for solids , which is chronic  .  No choking or regurgitation,  Normal EGD in 2013 for same.  Consider symptoms more of a globus sensation,  No further  workup at this time      Multinodular goiter Thyroid function is WNL and her current globus symptoms are unchanged.      IBS (irritable colon syndrome) Trial of linzess recommended.    A total of 40 minutes was spent with patient more than half of which was spent in counseling patient on the above mentioned issues , reviewing and explaining recent labs and imaging studies done, and coordination of care.  Updated Medication List Outpatient Encounter Prescriptions as of 08/30/2014  Medication Sig  . Linaclotide (LINZESS) 145 MCG CAPS capsule Take 1 capsule (145 mcg total) by mouth daily.  . [DISCONTINUED] albuterol (PROVENTIL HFA;VENTOLIN HFA) 108 (90 BASE) MCG/ACT inhaler Inhale 2 puffs into the lungs every 6 (six) hours as needed for wheezing.  . [DISCONTINUED] ciprofloxacin (CIPRO) 250 MG tablet Take 1 tablet (250 mg total) by mouth 2 (two) times daily.  . [DISCONTINUED] doxycycline (VIBRA-TABS) 100 MG tablet Take 1 tablet (100  mg total) by mouth 2 (two) times daily.   No facility-administered encounter medications on file as of 08/30/2014.     Orders Placed This Encounter  Procedures  . DG Chest 2 View  . CBC with Differential/Platelet  . Lipid panel  . Comprehensive metabolic panel  . Lipase  . TSH  . EKG 12-Lead  . EKG 12-Lead    Return in about 1 month (around 09/30/2014).

## 2014-08-30 NOTE — Progress Notes (Signed)
Pre-visit discussion using our clinic review tool. No additional management support is needed unless otherwise documented below in the visit note.  

## 2014-08-31 ENCOUNTER — Encounter: Payer: Self-pay | Admitting: Internal Medicine

## 2014-08-31 DIAGNOSIS — K589 Irritable bowel syndrome without diarrhea: Secondary | ICD-10-CM | POA: Insufficient documentation

## 2014-08-31 DIAGNOSIS — R072 Precordial pain: Secondary | ICD-10-CM

## 2014-08-31 MED ORDER — LINACLOTIDE 145 MCG PO CAPS
145.0000 ug | ORAL_CAPSULE | Freq: Every day | ORAL | Status: DC
Start: 2014-08-31 — End: 2017-08-15

## 2014-08-31 NOTE — Assessment & Plan Note (Addendum)
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  EKG is normal today  And Her chest x ray is clear .  Given that there are no  signs of prior MRI or arrhythmia on EKG done in office No further workup at this time per patient preference.   Advised to resume PPI and stop drinking [facility].

## 2014-08-31 NOTE — Assessment & Plan Note (Signed)
She is experiencing dysphagia for solids , which is chronic  .  No choking or regurgitation,  Normal EGD in 2013 for same.  Consider symptoms more of a globus sensation,  No further  workup at this time

## 2014-08-31 NOTE — Assessment & Plan Note (Signed)
Trial of linzess recommended.

## 2014-08-31 NOTE — Assessment & Plan Note (Signed)
Thyroid function is WNL and her current globus symptoms are unchanged.

## 2014-08-31 NOTE — Assessment & Plan Note (Signed)
She has gained 14 lbs in the last 2 years and Body mass index is 33.54 kg/(m^2).   I have addressed  BMI and recommended a low glycemic index diet utilizing smaller more frequent meals to increase metabolism.  I have also recommended that patient start exercising with a goal of 30 minutes of aerobic exercise a minimum of 5 days per week. Screening for lipid disorders, thyroid and diabetes were  done today.

## 2014-08-31 NOTE — Assessment & Plan Note (Signed)
  Based on  current lipid profile, the risk of clinically significant CAD is 8% over the next 10 years, using the Framingham risk calculator. The [employer] of [employer]iology recommends starting patients aged 59 or higher on moderate intensity statin therapy for LDL between 70-189 and 10 yr risk of CAD > 7.5% ;  So I am recommending statin therapy with simvastatin

## 2014-08-31 NOTE — Assessment & Plan Note (Addendum)
She continues to report a  is experiencing dysphagia for solids , which is chronic and has been worked up with both EGD and ENT evaluation .  Sh ehas not had any choking or regurgitation,  More of a globus sensation.

## 2014-09-01 NOTE — Telephone Encounter (Signed)
FYI

## 2014-09-10 ENCOUNTER — Encounter: Payer: 59 | Admitting: Internal Medicine

## 2014-10-04 ENCOUNTER — Encounter: Payer: Self-pay | Admitting: Internal Medicine

## 2014-10-04 ENCOUNTER — Other Ambulatory Visit (HOSPITAL_COMMUNITY)
Admission: RE | Admit: 2014-10-04 | Discharge: 2014-10-04 | Disposition: A | Discharge status: Home / Self Care | Payer: 59 | Source: Ambulatory Visit | Attending: Internal Medicine | Admitting: Internal Medicine

## 2014-10-04 ENCOUNTER — Ambulatory Visit (INDEPENDENT_AMBULATORY_CARE_PROVIDER_SITE_OTHER): Payer: 59 | Admitting: Internal Medicine

## 2014-10-04 VITALS — BP 114/78 | HR 83 | Temp 97.8°F | Resp 14 | Ht 64.0 in | Wt 195.5 lb

## 2014-10-04 DIAGNOSIS — Z1151 Encounter for screening for human papillomavirus (HPV): Secondary | ICD-10-CM | POA: Insufficient documentation

## 2014-10-04 DIAGNOSIS — Z79899 Other long term (current) drug therapy: Secondary | ICD-10-CM

## 2014-10-04 DIAGNOSIS — E669 Obesity, unspecified: Secondary | ICD-10-CM

## 2014-10-04 DIAGNOSIS — Z90710 Acquired absence of both cervix and uterus: Secondary | ICD-10-CM | POA: Diagnosis not present

## 2014-10-04 DIAGNOSIS — Z1239 Encounter for other screening for malignant neoplasm of breast: Secondary | ICD-10-CM

## 2014-10-04 DIAGNOSIS — Z9889 Other specified postprocedural states: Secondary | ICD-10-CM

## 2014-10-04 DIAGNOSIS — Z124 Encounter for screening for malignant neoplasm of cervix: Secondary | ICD-10-CM

## 2014-10-04 DIAGNOSIS — E785 Hyperlipidemia, unspecified: Secondary | ICD-10-CM

## 2014-10-04 DIAGNOSIS — Z0000 Encounter for general adult medical examination without abnormal findings: Secondary | ICD-10-CM | POA: Diagnosis not present

## 2014-10-04 DIAGNOSIS — M5010 Cervical disc disorder with radiculopathy, unspecified cervical region: Secondary | ICD-10-CM

## 2014-10-04 DIAGNOSIS — Z01419 Encounter for gynecological examination (general) (routine) without abnormal findings: Secondary | ICD-10-CM | POA: Insufficient documentation

## 2014-10-04 DIAGNOSIS — Z9049 Acquired absence of other specified parts of digestive tract: Secondary | ICD-10-CM

## 2014-10-04 MED ORDER — SIMVASTATIN 20 MG PO TABS: 20.0000 mg | ORAL_TABLET | Freq: Every day | ORAL | Status: DC

## 2014-10-04 NOTE — Progress Notes (Signed)
Patient ID: Sandra Russo, female    DOB: [birth year]  Age: 59 y.o. MRN: [id number]  The patient is here for annual  wellness examination and management of other chronic and acute problems.    The risk factors are reflected in the social history.  The roster of all physicians providing medical care to patient - is listed in the Snapshot section of the chart.  Activities of daily living:  The patient is 100% independent in all ADLs: dressing, toileting, feeding as well as independent mobility  Home safety : The patient has smoke detectors in the home. They wear seatbelts.  There are no firearms at home. There is no violence in the home.   There is no risks for hepatitis, STDs or HIV. There is no   history of blood transfusion. They have no travel history to infectious disease endemic areas of the world.  The patient has seen their dentist in the last six month. They have seen their eye doctor in the last year. They admit to slight hearing difficulty with regard to whispered voices and some television programs.  They have deferred audiologic testing in the last year.  They do not  have excessive sun exposure. Discussed the need for sun protection: hats, long sleeves and use of sunscreen if there is significant sun exposure.   Diet: the importance of a healthy diet is discussed. They do have a healthy diet.  The benefits of regular aerobic exercise were discussed. She walks 4 times per week ,  20 minutes.   Depression screen: there are no signs or vegative symptoms of depression- irritability, change in appetite, anhedonia, sadness/tearfullness.  Cognitive assessment: the patient manages all their financial and personal affairs and is actively engaged. They could relate day,date,year and events; recalled 2/3 objects at 3 minutes; performed clock-face test normally.  The following portions of the patient's history were reviewed and updated as appropriate: allergies, current medications, past  family history, past medical history,  past surgical history, past social history  and problem list.  Visual acuity was not assessed per patient preference since she has regular follow up with her ophthalmologist. Hearing and body mass index were assessed and reviewed.   During the course of the visit the patient was educated and counseled about appropriate screening and preventive services including : fall prevention , diabetes screening, nutrition counseling, colorectal cancer screening, and recommended immunizations.    CC:   Weight gain. She is not exercising ,  Not following a diet.     Chest pain :Has not had any more chest pain ,  Scheduled to see Dr Lorenz Coaster in July   LUQ pain:  seh continues to have  LUQ pain with bending over to tie shoes or bending over a counter, .  For the past year, episodic, doesn't happen every tine. The pain radiates to her left elbow and is deep and aching.      History Talicia has a past medical history of Acquired lymphedema of leg; Dysphagia; multinodular goiter; and Esophageal dysphagia.   She has past surgical history that includes Cholecystectomy (Oct 2013) and Abdominal hysterectomy (1984).   Her family history includes Hypertension in her father and mother.She reports that she has never smoked. She has never used smokeless tobacco. She reports that she does not drink alcohol or use illicit drugs.  Outpatient Prescriptions Prior to Visit  Medication Sig Dispense Refill  . Linaclotide (LINZESS) 145 MCG CAPS capsule Take 1 capsule (145 mcg total) by mouth daily.  30 capsule 3   No facility-administered medications prior to visit.    Review of Systems   Patient denies headache, fevers, malaise, unintentional weight loss, skin rash, eye pain, sinus congestion and sinus pain, sore throat, dysphagia,  hemoptysis , cough, dyspnea, wheezing, chest pain, palpitations, orthopnea, edema, abdominal pain, nausea, melena, diarrhea, constipation, flank pain,  dysuria, hematuria, urinary  Frequency, nocturia, numbness, tingling, seizures,  Focal weakness, Loss of consciousness,  Tremor, insomnia, depression, anxiety, and suicidal ideation.      Objective:  BP 114/78 mmHg  Pulse 83  Temp(Src) 97.8 F (36.6 C) (Oral)  Resp 14  Ht  (1.626 m)  Wt 195 lb 8 oz (88.678 kg)  BMI 33.54 kg/m2  SpO2 97%  Physical Exam  General appearance: alert, cooperative and appears stated age Head: Normocephalic, without obvious abnormality, atraumatic Eyes: conjunctivae/corneas clear. PERRL, EOM's intact. Fundi benign. Ears: normal TM's and external ear canals both ears Nose: Nares normal. Septum midline. Mucosa normal. No drainage or sinus tenderness. Throat: lips, mucosa, and tongue normal; teeth and gums normal Neck: no adenopathy, no carotid bruit, no JVD, supple, symmetrical, trachea midline and thyroid not enlarged, symmetric, no tenderness/mass/nodules Lungs: clear to auscultation bilaterally Breasts: normal appearance, no masses or tenderness Heart: regular rate and rhythm, S1, S2 normal, no murmur, click, rub or gallop Abdomen: soft, non-tender; bowel sounds normal; no masses,  no organomegaly Extremities: extremities normal, atraumatic, no cyanosis or edema Pulses: 2+ and symmetric Skin: Skin color, texture, turgor normal. No rashes or lesions Neurologic: Alert and oriented X 3, normal strength and tone. Normal symmetric reflexes. Normal coordination and gait.    Assessment & Plan:   Problem List Items Addressed This Visit    Hyperlipidemia with target LDL less than 160    Trial of simvastatin  Advised for tne year risk of CAD > 7.5%.       Relevant Medications   simvastatin (ZOCOR) 20 MG tablet   Other Relevant Orders   Lipid panel   Obesity    I have addressed  BMI and recommended wt loss of 10% of body weigh over the next 6 months using a low glycemic index diet and regular exercise a minimum of 5 days per week.        Visit  for preventive health examination    Annual wellness  exam was done as well as a comprehensive physical exam and management of acute and chronic conditions .  During the course of the visit the patient was educated and counseled about appropriate screening and preventive services including :  diabetes screening, lipid analysis with projected  10 year  risk for CAD , nutrition counseling, colorectal cancer screening, and recommended immunizations.  Printed recommendations for health maintenance screenings was given.       S/P hysterectomy - Primary   S/P laparoscopic cholecystectomy    Other Visit Diagnoses    Breast cancer screening        Relevant Orders    MM DIGITAL SCREENING BILATERAL    Cervical disc disorder with radiculopathy of cervical region        Relevant Orders    DG Cervical Spine 2 or 3 views    Long-term use of high-risk medication        Relevant Orders    Comprehensive metabolic panel    Cervical cancer screening        Relevant Orders    Cytology - PAP       I am having Ms.  Glomski start on simvastatin. I am also having her maintain her Linaclotide.  Meds ordered this encounter  Medications  . simvastatin (ZOCOR) 20 MG tablet    Sig: Take 1 tablet (20 mg total) by mouth at bedtime.    Dispense:  90 tablet    Refill:  3    There are no discontinued medications.  Follow-up: No Follow-up on file.   Sherlene Shams, MD

## 2014-10-04 NOTE — Patient Instructions (Signed)
You have agreed to a trial of simvastatin to lower your cholesterol.  Please return in 6 weeks for repeat fasting labs and liver enzymes  You are advised to have a 3 D mammogram  We are also going to image your cervical spine to see if your arm pain is coming from degenerative changes in your spine    Health Maintenance Adopting a healthy lifestyle and getting preventive care can go a long way to promote health and wellness. Talk with your health care provider about what schedule of regular examinations is right for you. This is a good chance for you to check in with your provider about disease prevention and staying healthy. In between checkups, there are plenty of things you can do on your own. Experts have done a lot of research about which lifestyle changes and preventive measures are most likely to keep you healthy. Ask your health care provider for more information. WEIGHT AND DIET  Eat a healthy diet  Be sure to include plenty of vegetables, fruits, low-fat dairy products, and lean protein.  Do not eat a lot of foods high in solid fats, added sugars, or salt.  Get regular exercise. This is one of the most important things you can do for your health.  Most adults should exercise for at least 150 minutes each week. The exercise should increase your heart rate and make you sweat (moderate-intensity exercise).  Most adults should also do strengthening exercises at least twice a week. This is in addition to the moderate-intensity exercise.  Maintain a healthy weight  Body mass index (BMI) is a measurement that can be used to identify possible weight problems. It estimates body fat based on height and weight. Your health care provider can help determine your BMI and help you achieve or maintain a healthy weight.  For females 24 years of age and older:   A BMI below 18.5 is considered underweight.  A BMI of 18.5 to 24.9 is normal.  A BMI of 25 to 29.9 is considered  overweight.  A BMI of 30 and above is considered obese.  Watch levels of cholesterol and blood lipids  You should start having your blood tested for lipids and cholesterol at 59 years of age, then have this test every 5 years.  You may need to have your cholesterol levels checked more often if:  Your lipid or cholesterol levels are high.  You are older than 59 years of age.  You are at high risk for heart disease.  CANCER SCREENING   Lung Cancer  Lung cancer screening is recommended for adults 22-53 years old who are at high risk for lung cancer because of a history of smoking.  A yearly low-dose CT scan of the lungs is recommended for people who:  Currently smoke.  Have quit within the past 15 years.  Have at least a 30-pack-year history of smoking. A pack year is smoking an average of one pack of cigarettes a day for 1 year.  Yearly screening should continue until it has been 15 years since you quit.  Yearly screening should stop if you develop a health problem that would prevent you from having lung cancer treatment.  Breast Cancer  Practice breast self-awareness. This means understanding how your breasts normally appear and feel.  It also means doing regular breast self-exams. Let your health care provider know about any changes, no matter how small.  If you are in your 20s or 30s, you should have a clinical  breast exam (CBE) by a health care provider every 1-3 years as part of a regular health exam.  If you are 59 or older, have a CBE every year. Also consider having a breast X-ray (mammogram) every year.  If you have a family history of breast cancer, talk to your health care provider about genetic screening.  If you are at high risk for breast cancer, talk to your health care provider about having an MRI and a mammogram every year.  Breast cancer gene (BRCA) assessment is recommended for women who have family members with BRCA-related cancers. BRCA-related  cancers include:  Breast.  Ovarian.  Tubal.  Peritoneal cancers.  Results of the assessment will determine the need for genetic counseling and BRCA1 and BRCA2 testing. Cervical Cancer Routine pelvic examinations to screen for cervical cancer are no longer recommended for nonpregnant women who are considered low risk for cancer of the pelvic organs (ovaries, uterus, and vagina) and who do not have symptoms. A pelvic examination may be necessary if you have symptoms including those associated with pelvic infections. Ask your health care provider if a screening pelvic exam is right for you.   The Pap test is the screening test for cervical cancer for women who are considered at risk.  If you had a hysterectomy for a problem that was not cancer or a condition that could lead to cancer, then you no longer need Pap tests.  If you are older than 65 years, and you have had normal Pap tests for the past 10 years, you no longer need to have Pap tests.  If you have had past treatment for cervical cancer or a condition that could lead to cancer, you need Pap tests and screening for cancer for at least 20 years after your treatment.  If you no longer get a Pap test, assess your risk factors if they change (such as having a new sexual partner). This can affect whether you should start being screened again.  Some women have medical problems that increase their chance of getting cervical cancer. If this is the case for you, your health care provider may recommend more frequent screening and Pap tests.  The human papillomavirus (HPV) test is another test that may be used for cervical cancer screening. The HPV test looks for the virus that can cause cell changes in the cervix. The cells collected during the Pap test can be tested for HPV.  The HPV test can be used to screen women 7 years of age and older. Getting tested for HPV can extend the interval between normal Pap tests from three to five  years.  An HPV test also should be used to screen women of any age who have unclear Pap test results.  After 59 years of age, women should have HPV testing as often as Pap tests.  Colorectal Cancer  This type of cancer can be detected and often prevented.  Routine colorectal cancer screening usually begins at 60 years of age and continues through 59 years of age.  Your health care provider may recommend screening at an earlier age if you have risk factors for colon cancer.  Your health care provider may also recommend using home test kits to check for hidden blood in the stool.  A small camera at the end of a tube can be used to examine your colon directly (sigmoidoscopy or colonoscopy). This is done to check for the earliest forms of colorectal cancer.  Routine screening usually begins at age  50.  Direct examination of the colon should be repeated every 5-10 years through 59 years of age. However, you may need to be screened more often if early forms of precancerous polyps or small growths are found. Skin Cancer  Check your skin from head to toe regularly.  Tell your health care provider about any new moles or changes in moles, especially if there is a change in a mole's shape or color.  Also tell your health care provider if you have a mole that is larger than the size of a pencil eraser.  Always use sunscreen. Apply sunscreen liberally and repeatedly throughout the day.  Protect yourself by wearing long sleeves, pants, a wide-brimmed hat, and sunglasses whenever you are outside. HEART DISEASE, DIABETES, AND HIGH BLOOD PRESSURE   Have your blood pressure checked at least every 1-2 years. High blood pressure causes heart disease and increases the risk of stroke.  If you are between 29 years and 50 years old, ask your health care provider if you should take aspirin to prevent strokes.  Have regular diabetes screenings. This involves taking a blood sample to check your fasting  blood sugar level.  If you are at a normal weight and have a low risk for diabetes, have this test once every three years after 59 years of age.  If you are overweight and have a high risk for diabetes, consider being tested at a younger age or more often. PREVENTING INFECTION  Hepatitis B  If you have a higher risk for hepatitis B, you should be screened for this virus. You are considered at high risk for hepatitis B if:  You were born in a country where hepatitis B is common. Ask your health care provider which countries are considered high risk.  Your parents were born in a high-risk country, and you have not been immunized against hepatitis B (hepatitis B vaccine).  You have HIV or AIDS.  You use needles to inject street drugs.  You live with someone who has hepatitis B.  You have had sex with someone who has hepatitis B.  You get hemodialysis treatment.  You take certain medicines for conditions, including cancer, organ transplantation, and autoimmune conditions. Hepatitis C  Blood testing is recommended for:  Everyone born from [birth year] through 1965.  Anyone with known risk factors for hepatitis C. Sexually transmitted infections (STIs)  You should be screened for sexually transmitted infections (STIs) including gonorrhea and chlamydia if:  You are sexually active and are younger than 59 years of age.  You are older than 59 years of age and your health care provider tells you that you are at risk for this type of infection.  Your sexual activity has changed since you were last screened and you are at an increased risk for chlamydia or gonorrhea. Ask your health care provider if you are at risk.  If you do not have HIV, but are at risk, it may be recommended that you take a prescription medicine daily to prevent HIV infection. This is called pre-exposure prophylaxis (PrEP). You are considered at risk if:  You are sexually active and do not regularly use condoms or know  the HIV status of your partner(s).  You take drugs by injection.  You are sexually active with a partner who has HIV. Talk with your health care provider about whether you are at high risk of being infected with HIV. If you choose to begin PrEP, you should first be tested for HIV. You should  then be tested every 3 months for as long as you are taking PrEP.  PREGNANCY   If you are premenopausal and you may become pregnant, ask your health care provider about preconception counseling.  If you may become pregnant, take 400 to 800 micrograms (mcg) of folic acid every day.  If you want to prevent pregnancy, talk to your health care provider about birth control (contraception). OSTEOPOROSIS AND MENOPAUSE   Osteoporosis is a disease in which the bones lose minerals and strength with aging. This can result in serious bone fractures. Your risk for osteoporosis can be identified using a bone density scan.  If you are 51 years of age or older, or if you are at risk for osteoporosis and fractures, ask your health care provider if you should be screened.  Ask your health care provider whether you should take a calcium or vitamin D supplement to lower your risk for osteoporosis.  Menopause may have certain physical symptoms and risks.  Hormone replacement therapy may reduce some of these symptoms and risks. Talk to your health care provider about whether hormone replacement therapy is right for you.  HOME CARE INSTRUCTIONS   Schedule regular health, dental, and eye exams.  Stay current with your immunizations.   Do not use any tobacco products including cigarettes, chewing tobacco, or electronic cigarettes.  If you are pregnant, do not drink alcohol.  If you are breastfeeding, limit how much and how often you drink alcohol.  Limit alcohol intake to no more than 1 drink per day for nonpregnant women. One drink equals 12 ounces of beer, 5 ounces of wine, or 1 ounces of hard liquor.  Do not  use street drugs.  Do not share needles.  Ask your health care provider for help if you need support or information about quitting drugs.  Tell your health care provider if you often feel depressed.  Tell your health care provider if you have ever been abused or do not feel safe at home. Document Released: 10/16/2010 Document Revised: 08/17/2013 Document Reviewed: 03/04/2013 [facility] Patient Information 2015 [place], [place]. This information is not intended to replace advice given to you by your health care provider. Make sure you discuss any questions you have with your health care provider.

## 2014-10-04 NOTE — Progress Notes (Signed)
Pre-visit discussion using our clinic review tool. No additional management support is needed unless otherwise documented below in the visit note.  

## 2014-10-04 NOTE — Assessment & Plan Note (Addendum)
Trial of simvastatin  Advised for tne year risk of CAD > 7.5%.

## 2014-10-05 DIAGNOSIS — Z0000 Encounter for general adult medical examination without abnormal findings: Secondary | ICD-10-CM | POA: Insufficient documentation

## 2014-10-05 NOTE — Assessment & Plan Note (Signed)
I have addressed  BMI and recommended wt loss of 10% of body weigh over the next 6 months using a low glycemic index diet and regular exercise a minimum of 5 days per week.   

## 2014-10-05 NOTE — Assessment & Plan Note (Signed)

## 2014-10-06 ENCOUNTER — Encounter: Payer: 59 | Admitting: Internal Medicine

## 2014-10-06 LAB — CYTOLOGY - PAP

## 2014-10-11 ENCOUNTER — Encounter: Payer: Self-pay | Admitting: Internal Medicine

## 2014-10-22 ENCOUNTER — Encounter: Payer: Self-pay | Admitting: Cardiovascular Disease

## 2014-10-22 ENCOUNTER — Ambulatory Visit (INDEPENDENT_AMBULATORY_CARE_PROVIDER_SITE_OTHER): Payer: 59 | Admitting: Cardiovascular Disease

## 2014-10-22 VITALS — BP 120/80 | HR 75 | Ht 64.0 in | Wt 197.2 lb

## 2014-10-22 DIAGNOSIS — R079 Chest pain, unspecified: Secondary | ICD-10-CM | POA: Diagnosis not present

## 2014-10-22 DIAGNOSIS — E669 Obesity, unspecified: Secondary | ICD-10-CM

## 2014-10-22 DIAGNOSIS — E785 Hyperlipidemia, unspecified: Secondary | ICD-10-CM | POA: Diagnosis not present

## 2014-10-22 DIAGNOSIS — R000 Tachycardia, unspecified: Secondary | ICD-10-CM

## 2014-10-22 MED ORDER — DILTIAZEM HCL 30 MG PO TABS: 30.0000 mg | ORAL_TABLET | Freq: Three times a day (TID) | ORAL | Status: DC | PRN

## 2014-10-22 NOTE — Assessment & Plan Note (Signed)
We have encouraged continued exercise, careful diet management in an effort to lose weight. 

## 2014-10-22 NOTE — Assessment & Plan Note (Signed)
She denies any further chest pain since the episode of tachycardia in April of this year. We have offered coronary calcium scoring as a way to estimate her risk. Other testing such a stress testing could be ordered for recurrent chest pain symptoms

## 2014-10-22 NOTE — Progress Notes (Signed)
Patient ID: Sandra Russo, female    DOB: 12-May-1955, 59 y.o.   MRN: [id number]  HPI Comments: Ms. Sandra Russo is a 59 year old woman with history of hyperlipidemia, obesity, strong family history of coronary artery disease, mother died of MI at age 59, who presents by referral from Dr. Darrick Huntsmanullo for symptoms of tachycardia. 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  She reports that in April 2016 she had worked a long 12 hour shift, was laying down in bed. She got up after she hurt her grandchildren and shortly after developed a tachycardia. She described her symptoms as a rapid rhythm, irregular that "would not stop". Symptoms lasted 30-45 minutes with radiating discomfort into her left arm. She tried to relax Malay back down. Symptoms resolved without intervention. Since then she's not had any further episodes apart from rare palpitations which she calls PVCs.   In general she is active, denies having any shortness of breath or chest discomfort with exertion  EKG on today's visit shows normal sinus rhythm with rate 75 bpm, no significant ST or T-wave changes   Allergies  Allergen Reactions  . Aspirin     GI upset    Current Outpatient Prescriptions on File Prior to Visit  Medication Sig Dispense Refill  . Linaclotide (LINZESS) 145 MCG CAPS capsule Take 1 capsule (145 mcg total) by mouth daily. 30 capsule 3  . simvastatin (ZOCOR) 20 MG tablet Take 1 tablet (20 mg total) by mouth at bedtime. 90 tablet 3   No current facility-administered medications on file prior to visit.    Past Medical History  Diagnosis Date  . Acquired lymphedema of leg     right leg  . Dysphagia     [facility]  . multinodular goiter   . Esophageal dysphagia     Past Surgical History  Procedure Laterality Date  . Cholecystectomy  Oct 2013    [facility]  . Abdominal hysterectomy  1984    Social History  reports that she has never smoked. She has never used smokeless tobacco. She  reports that she does not drink alcohol or use illicit drugs.  Family History family history includes Hypertension in her father and mother.   Review of Systems  Constitutional: Negative.   Respiratory: Negative.   Cardiovascular: Positive for palpitations.       Tachycardia  Gastrointestinal: Negative.   Musculoskeletal: Negative.   Skin: Negative.   Neurological: Negative.   Hematological: Negative.   Psychiatric/Behavioral: Negative.   All other systems reviewed and are negative.   BP 120/80 mmHg  Pulse 75  Ht 5\' 4"  (1.626 m)  Wt 197 lb 4 oz (89.472 kg)  BMI 33.84 kg/m2   Physical Exam  Constitutional: She is oriented to person, place, and time. She appears well-developed and well-nourished.  HENT:  Head: Normocephalic.  Nose: Nose normal.  Mouth/Throat: Oropharynx is clear and moist.  Eyes: Conjunctivae are normal. Pupils are equal, round, and reactive to light.  Neck: Normal range of motion. Neck supple. No JVD present.  Cardiovascular: Normal rate, regular rhythm, S1 normal, S2 normal, normal heart sounds and intact distal pulses.  Exam reveals no gallop and no friction rub.   No murmur heard. Pulmonary/Chest: Effort normal and breath sounds normal. No respiratory distress. She has no wheezes. She has no rales. She exhibits no tenderness.  Abdominal: Soft. Bowel sounds are normal. She exhibits no distension. There is no tenderness.  Musculoskeletal: Normal range of motion. She exhibits  no edema or tenderness.  Lymphadenopathy:    She has no cervical adenopathy.  Neurological: She is alert and oriented to person, place, and time. Coordination normal.  Skin: Skin is warm and dry. No rash noted. No erythema.  Psychiatric: She has a normal mood and affect. Her behavior is normal. Judgment and thought content normal.    Assessment and Plan  Nursing note and vitals reviewed.

## 2014-10-22 NOTE — Assessment & Plan Note (Signed)
Etiology unclear though concerning for atrial fibrillation. She reports irregular rhythm lasting 30-45 minutes. One episode with no further episodes since April 2016. We have given her a prescription for diltiazem 30 mg pills to take as needed for recurrent tachycardia If she does have episodes of tachycardia, recommended that she call our office. 30 day monitor could be ordered

## 2014-10-22 NOTE — Patient Instructions (Signed)
You are doing well. No medication changes were made.  Please take diltiazem as needed for tachycardia/possible atrial fib  Also call if you would like to schedule the CT coronary calcium score (done in [place])  Goal total cholesterol <180, LDL <100  Please call us if you have new issues that need to be addressed before your next appt.

## 2014-10-22 NOTE — Assessment & Plan Note (Signed)
Encouraged her to stay on her simvastatin with close follow-up with Dr. Darrick Huntsmanullo. [place] risk stratification given her strong family history of coronary artery disease and MI. This was discussed with her. She would like to think about it first

## 2014-11-16 ENCOUNTER — Other Ambulatory Visit: Payer: 59

## 2014-11-18 ENCOUNTER — Other Ambulatory Visit (INDEPENDENT_AMBULATORY_CARE_PROVIDER_SITE_OTHER): Payer: 59

## 2014-11-18 DIAGNOSIS — E785 Hyperlipidemia, unspecified: Secondary | ICD-10-CM | POA: Diagnosis not present

## 2014-11-18 DIAGNOSIS — Z79899 Other long term (current) drug therapy: Secondary | ICD-10-CM | POA: Diagnosis not present

## 2014-11-18 LAB — COMPREHENSIVE METABOLIC PANEL
ALBUMIN: 4.1 g/dL (ref 3.5–5.2)
ALK PHOS: 84 U/L (ref 39–117)
ALT: 16 U/L (ref 0–35)
AST: 13 U/L (ref 0–37)
BILIRUBIN TOTAL: 0.4 mg/dL (ref 0.2–1.2)
BUN: 9 mg/dL (ref 6–23)
CALCIUM: 9.5 mg/dL (ref 8.4–10.5)
CO2: 30 mEq/L (ref 19–32)
CREATININE: 0.75 mg/dL (ref 0.40–1.20)
Chloride: 105 mEq/L (ref 96–112)
GFR: 101.8 mL/min (ref >60.00)
GLUCOSE: 106 mg/dL — AB (ref 70–99)
POTASSIUM: 3.6 meq/L (ref 3.5–5.1)
SODIUM: 142 meq/L (ref 135–145)
TOTAL PROTEIN: 7.1 g/dL (ref 6.0–8.3)

## 2014-11-18 LAB — LIPID PANEL
Cholesterol: 236 mg/dL — ABNORMAL HIGH (ref 0–200)
HDL: 56.1 mg/dL (ref >39.00)
LDL Cholesterol: 156 mg/dL — ABNORMAL HIGH (ref 0–99)
NONHDL: 180.34
Total CHOL/HDL Ratio: 4
Triglycerides: 123 mg/dL (ref 0.0–149.0)
VLDL: 24.6 mg/dL (ref 0.0–40.0)

## 2014-11-21 ENCOUNTER — Telehealth: Payer: Self-pay | Admitting: Internal Medicine

## 2014-11-21 NOTE — Telephone Encounter (Signed)
My Chart message sent

## 2017-01-23 ENCOUNTER — Encounter: Payer: Self-pay | Admitting: *Deleted

## 2017-01-23 ENCOUNTER — Ambulatory Visit
Admission: EM | Admit: 2017-01-23 | Discharge: 2017-01-23 | Disposition: A | Discharge status: Home / Self Care | Payer: 59 | Attending: Family Medicine | Admitting: Family Medicine

## 2017-01-23 DIAGNOSIS — N3001 Acute cystitis with hematuria: Secondary | ICD-10-CM | POA: Diagnosis present

## 2017-01-23 DIAGNOSIS — R300 Dysuria: Secondary | ICD-10-CM

## 2017-01-23 LAB — URINALYSIS, COMPLETE (UACMP) WITH MICROSCOPIC
Bilirubin Urine: NEGATIVE
GLUCOSE, UA: NEGATIVE mg/dL
Ketones, ur: NEGATIVE mg/dL
Nitrite: NEGATIVE
PH: 5.5 (ref 5.0–8.0)
SPECIFIC GRAVITY, URINE: 1.01 (ref 1.005–1.030)

## 2017-01-23 MED ORDER — CEPHALEXIN 500 MG PO CAPS: 500.0000 mg | ORAL_CAPSULE | Freq: Four times a day (QID) | ORAL | 0 refills | Status: AC

## 2017-01-23 NOTE — Discharge Instructions (Signed)
UTI, cx pending. Rest,push fluids, may take OTC AZO as label directed for bladder spasms. Follow up with your PCP. Return to UC as needed. Go to Er for N,V,Fever, Flank pain,etc.

## 2017-01-23 NOTE — ED Provider Notes (Signed)
[facility]    CSN: [id number] Arrival date & time: 01/23/17  1635     History   Chief Complaint Chief Complaint  Patient presents with  . Recurrent UTI    HPI Sandra Russo is a 61 y.o. female.   The history is provided by the patient. No language interpreter was used.  Dysuria  Pain quality:  Burning Pain severity:  Mild Onset quality:  Sudden Duration:  2 days Timing:  Constant Progression:  Unchanged Chronicity:  Recurrent Recent urinary tract infections: no (09/2012)   Relieved by:  Nothing Worsened by:  Nothing Ineffective treatments:  None tried Urinary symptoms: frequent urination   Associated symptoms: no abdominal pain, no fever, no flank pain, no genital lesions, no nausea, no vaginal discharge and no vomiting     Past Medical History:  Diagnosis Date  . Acquired lymphedema of leg    right leg  . Dysphagia    treated with esophgeal dilation at [facility]  . Esophageal dysphagia   . multinodular goiter     Patient Active Problem List   Diagnosis Date Noted  . Acute cystitis with hematuria 01/23/2017  . Tachycardia 10/22/2014  . Visit for preventive health examination 10/05/2014  . S/P hysterectomy 10/04/2014  . S/P laparoscopic cholecystectomy 10/04/2014  . IBS (irritable colon syndrome) 08/31/2014  . Obesity 08/30/2014  . Chest pain 08/30/2014  . Hyperlipidemia with target LDL less than 160 08/21/2011  . Esophageal dysphagia   . Multinodular goiter 08/20/2011  . Acquired lymphedema of leg     Past Surgical History:  Procedure Laterality Date  . ABDOMINAL HYSTERECTOMY  1984  . CHOLECYSTECTOMY  Oct 2013   [facility]    OB History    No data available       Home Medications    Prior to Admission medications   Medication Sig Start Date End Date Taking? Authorizing Provider  cephALEXin (KEFLEX) 500 MG capsule Take 1 capsule (500 mg total) by mouth 4 (four) times daily. 01/23/17 01/30/17  Quinesha Selinger, Para March, NP  diltiazem  (CARDIZEM) 30 MG tablet Take 1 tablet (30 mg total) by mouth 3 (three) times daily as needed (for tachycardia). 10/22/14   Antonieta Iba, MD  Linaclotide (LINZESS) 145 MCG CAPS capsule Take 1 capsule (145 mcg total) by mouth daily. 08/31/14   Sherlene Shams, MD  simvastatin (ZOCOR) 20 MG tablet Take 1 tablet (20 mg total) by mouth at bedtime. 10/04/14   Sherlene Shams, MD    Family History Family History  Problem Relation Age of Onset  . Hypertension Mother   . Hypertension Father     Social History Social History  Substance Use Topics  . Smoking status: Never Smoker  . Smokeless tobacco: Never Used  . Alcohol use No     Allergies   Aspirin   Review of Systems Review of Systems  Constitutional: Negative for fever.  Gastrointestinal: Negative for abdominal pain, nausea and vomiting.  Genitourinary: Positive for dysuria. Negative for flank pain and vaginal discharge.  All other systems reviewed and are negative.    Physical Exam Triage Vital Signs ED Triage Vitals  Enc Vitals Group     BP      Pulse      Resp      Temp      Temp src      SpO2      Weight      Height      Head Circumference  Peak Flow      Pain Score      Pain Loc      Pain Edu?      Excl. in GC?    No data found.   Updated Vital Signs BP 137/67 (BP Location: Left Arm)   Pulse 76   Temp 98.4 F (36.9 C) (Oral)   Resp 12   Ht  (1.626 m)   Wt 198 lb (89.8 kg)   SpO2 97%   BMI 33.99 kg/m   Visual Acuity  Physical Exam  Constitutional: She is oriented to person, place, and time. Vital signs are normal. She appears well-developed and well-nourished. She is active. No distress.  HENT:  Head: Normocephalic.  Eyes: Pupils are equal, round, and reactive to light.  Neck: Normal range of motion.  Cardiovascular: Normal rate and regular rhythm.   Pulmonary/Chest: Effort normal and breath sounds normal.  Abdominal: Normal appearance and bowel sounds are normal. There is no  tenderness.  Musculoskeletal: Normal range of motion.  Neurological: She is alert and oriented to person, place, and time. GCS eye subscore is 4. GCS verbal subscore is 5. GCS motor subscore is 6.  Skin: Skin is warm and dry.  Psychiatric: She has a normal mood and affect. Her speech is normal and behavior is normal.  Nursing note and vitals reviewed.    UC Treatments / Results  Labs (all labs ordered are listed, but only abnormal results are displayed) Labs Reviewed  URINALYSIS, COMPLETE (UACMP) WITH MICROSCOPIC - Abnormal; Notable for the following:       Result Value   APPearance HAZY (*)    Hgb urine dipstick SMALL (*)    Protein, ur TRACE (*)    Leukocytes, UA LARGE (*)    Squamous Epithelial / LPF 0-5 (*)    Bacteria, UA FEW (*)    All other components within normal limits  URINE CULTURE    EKG  EKG Interpretation None       Radiology No results found.  Procedures Procedures (including critical care time)  Medications Ordered in UC Medications - No data to display   Initial Impression / Assessment and Plan / UC Course  I have reviewed the triage vital signs and the nursing notes.  Pertinent labs & imaging results that were available during my care of the patient were reviewed by me and considered in my medical decision making (see chart for details).   UTI, cx pending. Rest,push fluids, may take OTC AZO as label directed for bladder spasms. Follow up with your PCP. Return to UC as needed. Go to Er for N,V,Fever, Flank pain,etc. Pt verbalized understanding to this provider.   Final Clinical Impressions(s) / UC Diagnoses   Final diagnoses:  Acute cystitis with hematuria    New Prescriptions Discharge Medication List as of 01/23/2017  5:37 PM    START taking these medications   Details  cephALEXin (KEFLEX) 500 MG capsule Take 1 capsule (500 mg total) by mouth 4 (four) times daily., Starting Wed 01/23/2017, Until Wed 01/30/2017, Normal          Controlled Substance Prescriptions    [place], Para March, NP 01/23/17 1907

## 2017-01-23 NOTE — ED Triage Notes (Signed)
C/O  "bladder spasms" frequent urination. Started having these symptoms after trying a new toilet paper on Monday.

## 2017-01-25 LAB — URINE CULTURE: Culture: <10000 — AB

## 2017-08-15 ENCOUNTER — Other Ambulatory Visit (HOSPITAL_COMMUNITY)
Admission: RE | Admit: 2017-08-15 | Discharge: 2017-08-15 | Disposition: A | Discharge status: Home / Self Care | Payer: 59 | Source: Ambulatory Visit | Attending: Internal Medicine | Admitting: Internal Medicine

## 2017-08-15 ENCOUNTER — Encounter: Payer: Self-pay | Admitting: Internal Medicine

## 2017-08-15 ENCOUNTER — Ambulatory Visit (INDEPENDENT_AMBULATORY_CARE_PROVIDER_SITE_OTHER): Payer: 59 | Admitting: Internal Medicine

## 2017-08-15 VITALS — BP 124/72 | HR 67 | Temp 97.7°F | Resp 14 | Ht 64.0 in | Wt 203.8 lb

## 2017-08-15 DIAGNOSIS — Z90710 Acquired absence of both cervix and uterus: Secondary | ICD-10-CM

## 2017-08-15 DIAGNOSIS — E042 Nontoxic multinodular goiter: Secondary | ICD-10-CM | POA: Diagnosis not present

## 2017-08-15 DIAGNOSIS — Z124 Encounter for screening for malignant neoplasm of cervix: Secondary | ICD-10-CM

## 2017-08-15 DIAGNOSIS — R5383 Other fatigue: Secondary | ICD-10-CM | POA: Diagnosis not present

## 2017-08-15 DIAGNOSIS — Z1231 Encounter for screening mammogram for malignant neoplasm of breast: Secondary | ICD-10-CM | POA: Diagnosis not present

## 2017-08-15 DIAGNOSIS — Z0000 Encounter for general adult medical examination without abnormal findings: Secondary | ICD-10-CM

## 2017-08-15 DIAGNOSIS — Z1239 Encounter for other screening for malignant neoplasm of breast: Secondary | ICD-10-CM

## 2017-08-15 DIAGNOSIS — E785 Hyperlipidemia, unspecified: Secondary | ICD-10-CM | POA: Diagnosis not present

## 2017-08-15 DIAGNOSIS — G4700 Insomnia, unspecified: Secondary | ICD-10-CM | POA: Diagnosis not present

## 2017-08-15 DIAGNOSIS — Z0001 Encounter for general adult medical examination with abnormal findings: Secondary | ICD-10-CM | POA: Diagnosis not present

## 2017-08-15 DIAGNOSIS — R7303 Prediabetes: Secondary | ICD-10-CM

## 2017-08-15 DIAGNOSIS — Z1211 Encounter for screening for malignant neoplasm of colon: Secondary | ICD-10-CM

## 2017-08-15 DIAGNOSIS — R7301 Impaired fasting glucose: Secondary | ICD-10-CM | POA: Diagnosis not present

## 2017-08-15 DIAGNOSIS — Z23 Encounter for immunization: Secondary | ICD-10-CM | POA: Diagnosis not present

## 2017-08-15 LAB — COMPREHENSIVE METABOLIC PANEL
ALT: 15 U/L (ref 0–35)
AST: 16 U/L (ref 0–37)
Albumin: 4.2 g/dL (ref 3.5–5.2)
Alkaline Phosphatase: 91 U/L (ref 39–117)
BUN: 8 mg/dL (ref 6–23)
CALCIUM: 9.5 mg/dL (ref 8.4–10.5)
CO2: 28 meq/L (ref 19–32)
CREATININE: 0.83 mg/dL (ref 0.40–1.20)
Chloride: 106 mEq/L (ref 96–112)
GFR: 89.73 mL/min (ref >60.00)
GLUCOSE: 93 mg/dL (ref 70–99)
Potassium: 3.8 mEq/L (ref 3.5–5.1)
SODIUM: 142 meq/L (ref 135–145)
Total Bilirubin: 0.4 mg/dL (ref 0.2–1.2)
Total Protein: 7.5 g/dL (ref 6.0–8.3)

## 2017-08-15 LAB — CBC WITH DIFFERENTIAL/PLATELET
BASOS PCT: 0.2 % (ref 0.0–3.0)
Basophils Absolute: 0 10*3/uL (ref 0.0–0.1)
EOS ABS: 0.2 10*3/uL (ref 0.0–0.7)
EOS PCT: 2.1 % (ref 0.0–5.0)
HCT: 38.6 % (ref 36.0–46.0)
Hemoglobin: 12.9 g/dL (ref 12.0–15.0)
LYMPHS ABS: 3.3 10*3/uL (ref 0.7–4.0)
Lymphocytes Relative: 43.9 % (ref 12.0–46.0)
MCHC: 33.4 g/dL (ref 30.0–36.0)
MCV: 88 fl (ref 78.0–100.0)
MONO ABS: 0.5 10*3/uL (ref 0.1–1.0)
Monocytes Relative: 6.3 % (ref 3.0–12.0)
NEUTROS ABS: 3.5 10*3/uL (ref 1.4–7.7)
Neutrophils Relative %: 47.5 % (ref 43.0–77.0)
PLATELETS: 363 10*3/uL (ref 150.0–400.0)
RBC: 4.39 Mil/uL (ref 3.87–5.11)
RDW: 14.1 % (ref 11.5–15.5)
WBC: 7.5 10*3/uL (ref 4.0–10.5)

## 2017-08-15 LAB — LIPID PANEL
CHOL/HDL RATIO: 4
Cholesterol: 247 mg/dL — ABNORMAL HIGH (ref 0–200)
HDL: 58.8 mg/dL (ref >39.00)
LDL Cholesterol: 170 mg/dL — ABNORMAL HIGH (ref 0–99)
NONHDL: 188.54
TRIGLYCERIDES: 95 mg/dL (ref 0.0–149.0)
VLDL: 19 mg/dL (ref 0.0–40.0)

## 2017-08-15 LAB — HEMOGLOBIN A1C: Hgb A1c MFr Bld: 5.9 % (ref 4.6–6.5)

## 2017-08-15 LAB — TSH: TSH: 5.18 u[IU]/mL — ABNORMAL HIGH (ref 0.35–4.50)

## 2017-08-15 MED ORDER — TRAZODONE HCL 50 MG PO TABS: 25.0000 mg | ORAL_TABLET | Freq: Every evening | ORAL | 3 refills | Status: AC | PRN

## 2017-08-15 NOTE — Progress Notes (Signed)
Patient ID: Sandra Russo, female    DOB: 03-08-1956  Age: 61 y.o. MRN: [id number]  The patient is here for annual preventive examination  and management of other chronic and acute problems.  Last seen nearly 3 years ago (June 2016)  Colonoscopy incomplete 2013 did not want to repeat  PAP smear 2016: no endocervical component  She is  S/p "supracervical hysterectomy (vs "partial hysterectomy at age 48 )   The risk factors are reflected in the social history.  The roster of all physicians providing medical care to patient - is listed in the Snapshot section of the chart.  Activities of daily living:  The patient is 100% independent in all ADLs: dressing, toileting, feeding as well as independent mobility  Home safety : The patient has smoke detectors in the home. They wear seatbelts.  There are no firearms at home. There is no violence in the home.   There is no risks for hepatitis, STDs or HIV. There is no   history of blood transfusion. They have no travel history to infectious disease endemic areas of the world.  The patient has seen their dentist in the last six month. They have seen their eye doctor in the last year. They deny  hearing difficulty with regard to whispered voices and some television programs.  They have deferred audiologic testing in the last year.  They do not  have excessive sun exposure. Discussed the need for sun protection: hats, long sleeves and use of sunscreen if there is significant sun exposure.   Diet: the importance of a healthy diet is discussed. They do have a healthy diet.  The benefits of regular aerobic exercise were discussed. She walks 4 times per week ,  20 minutes.   Depression screen: there are no signs or vegative symptoms of depression- irritability, change in appetite, anhedonia, sadness/tearfullness.  Cognitive assessment: the patient manages all their financial and personal affairs and is actively engaged. They could relate day,date,year and  events; recalled 2/3 objects at 3 minutes; performed clock-face test normally.  The following portions of the patient's history were reviewed and updated as appropriate: allergies, current medications, past family history, past medical history,  past surgical history, past social history  and problem list.  Visual acuity was not assessed per patient preference since she has regular follow up with her ophthalmologist. Hearing and body mass index were assessed and reviewed.   During the course of the visit the patient was educated and counseled about appropriate screening and preventive services including : fall prevention , diabetes screening, nutrition counseling, colorectal cancer screening, and recommended immunizations.    CC: The primary encounter diagnosis was Screening for cervical cancer. Diagnoses of Colon cancer screening, Breast cancer screening, Hyperlipidemia with target LDL less than 160, Class 2 severe obesity due to excess calories with serious comorbidity in adult, unspecified BMI (HCC), Impaired fasting blood sugar, Fatigue, unspecified type, Need for diphtheria-tetanus-pertussis (Tdap) vaccine, S/P hysterectomy, Multinodular goiter, Visit for preventive health examination, Prediabetes, and Insomnia, unspecified type were also pertinent to this visit.  1) recurrent insomnia; frequent wakenings. 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   She has a past surgical history that includes  Cholecystectomy (Oct 2013) and Abdominal hysterectomy (1984).   Her family history includes Hypertension in her father and mother.She reports that she has never smoked. She has never used smokeless tobacco. She  reports that she does not drink alcohol or use drugs.  Outpatient Medications Prior to Visit  Medication Sig Dispense Refill  . diltiazem (CARDIZEM) 30 MG tablet Take 1 tablet (30 mg total) by mouth 3 (three) times daily as needed (for tachycardia). (Patient not taking: Reported on 08/15/2017) 60 tablet 1  . Linaclotide (LINZESS) 145 MCG CAPS capsule Take 1 capsule (145 mcg total) by mouth daily. (Patient not taking: Reported on 08/15/2017) 30 capsule 3  . simvastatin (ZOCOR) 20 MG tablet Take 1 tablet (20 mg total) by mouth at bedtime. (Patient not taking: Reported on 08/15/2017) 90 tablet 3   No facility-administered medications prior to visit.     Review of Systems   Patient denies headache, fevers, malaise, unintentional weight loss, skin rash, eye pain, sinus congestion and sinus pain, sore throat, dysphagia,  hemoptysis , cough, dyspnea, wheezing, chest pain, palpitations, orthopnea, edema, abdominal pain, nausea, melena, diarrhea, constipation, flank pain, dysuria, hematuria, urinary  Frequency, nocturia, numbness, tingling, seizures,  Focal weakness, Loss of consciousness,  Tremor,  depression, anxiety, and suicidal ideation.      Objective:  BP 124/72 (BP Location: Left Arm, Patient Position: Sitting, Cuff Size: Large)   Pulse 67   Temp 97.7 F (36.5 C) (Oral)   Resp 14   Ht  (1.626 m)   Wt 203 lb 12.8 oz (92.4 kg)   SpO2 97%   BMI 34.98 kg/m   Physical Exam   General Appearance:    Alert, cooperative, no distress, appears stated age  Head:    Normocephalic, without obvious abnormality, atraumatic  Eyes:    PERRL, conjunctiva/corneas clear, EOM's intact, fundi    benign, both eyes  Ears:    Normal TM's and external ear canals, both ears  Nose:   Nares normal, septum midline, mucosa normal, no drainage    or sinus tenderness  Throat:   Lips, mucosa, and tongue normal; teeth and gums normal  Neck:   Supple, symmetrical, trachea midline, no adenopathy;    thyroid:  no  enlargement/tenderness/nodules; no carotid   bruit or JVD  Back:     Symmetric, no curvature, ROM normal, no CVA tenderness  Lungs:     Clear to auscultation bilaterally, respirations unlabored  Chest Wall:    No tenderness or deformity   Heart:    Regular rate and rhythm, S1 and S2 normal, no murmur, rub   or gallop  Breast Exam:    No tenderness, masses, or nipple abnormality  Abdomen:     Soft, non-tender, bowel sounds active all four quadrants,    no masses, no organomegaly  Genitalia:    Pelvic: cervix surgically absent, external genitalia normal, no adnexal masses or tenderness,  rectovaginal septum normal, uterus surgically  absent and vagina normal without discharge  Extremities:   Extremities normal, atraumatic, no cyanosis or edema  Pulses:   2+ and symmetric all extremities  Skin:   Skin color, texture, turgor normal, no rashes or lesions  Lymph nodes:   Cervical, supraclavicular, and axillary nodes normal  Neurologic:   CNII-XII intact, normal strength, sensation and reflexes    throughout     Assessment & Plan:   Problem List Items Addressed This Visit    Obesity   Visit for preventive health examination    Annual  comprehensive preventive exam was done as well as an evaluation and management of chronic conditions .  During the course of the visit the patient was educated and counseled about appropriate screening and preventive services including :  diabetes screening, lipid analysis with projected  10 year  risk for CAD , nutrition counseling, breast, cervical and colorectal cancer screening, and recommended immunizations.  Printed recommendations for health maintenance screenings was given  Lab Results  Component Value Date   HGBA1C 5.9 08/15/2017         S/P hysterectomy    Patient appears to have had a total hysterectomy as oppose d to a supracervical hysterectomy       Prediabetes    Hesr random glucose is again elevated but not diagnostic of diabetes .  I  recommend he follow a low glycemic index diet and particpate regularly in an aerobic  exercise activity.  We should check an A1c in 6 months.       Multinodular goiter    Thyroid function is slightly underactive.  Wiil repeat in 6 weeks l.  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  She has not taken the medication  In over 4 weeks and current risk is 7.8%  .    Lab Results  Component Value Date   CHOL 247 (H) 08/15/2017   HDL 58.80 08/15/2017   LDLCALC 170 (H) 08/15/2017   TRIG 95.0 08/15/2017   CHOLHDL 4 08/15/2017         Relevant Orders   Lipid panel (Completed)    Other Visit Diagnoses    Screening for cervical cancer    -  Primary   Relevant Orders   Cytology - PAP (Completed)   Colon cancer screening       Relevant Orders   Ambulatory referral to Gastroenterology   Breast cancer screening       Relevant Orders   MM 3D SCREEN BREAST BILATERAL   Impaired fasting blood sugar       Relevant Orders   Hemoglobin A1c (Completed)   Comprehensive metabolic panel (Completed)   Fatigue, unspecified type       Relevant Orders   Hepatitis C antibody (Completed)   HIV antibody (Completed)   TSH (Completed)   CBC with Differential/Platelet (Completed)   Need for diphtheria-tetanus-pertussis (Tdap) vaccine       Relevant Orders   Tdap vaccine greater than or equal to 7yo IM (Completed)      I have discontinued Zamaria H. Armstead's linaclotide, simvastatin, and diltiazem. I am also having her start on traZODone.  Meds ordered this encounter  Medications  . traZODone (DESYREL) 50 MG tablet    Sig: Take 0.5-1 tablets (25-50 mg total) by mouth at bedtime as needed for sleep.    Dispense:  30 tablet    Refill:  3    Medications  Discontinued During This Encounter  Medication Reason  . diltiazem (CARDIZEM) 30 MG tablet Patient has not taken in last 30 days  . Linaclotide (LINZESS) 145 MCG CAPS capsule Patient has not taken in last 30 days  . simvastatin (ZOCOR) 20 MG tablet Patient  has not taken in last 30 days    Follow-up: Return in about 1 year (around 08/16/2018).   Sherlene Shams, MD

## 2017-08-15 NOTE — Patient Instructions (Signed)
Mammogram and referral for colonoscopy in process  Trial of trazodone for insomnia 1/2 to 1 tablet 30 minutes  before bedtime   Fasting labs today and screen for diabetes, thyroid,  Hep c  Hiv     Health Maintenance for Postmenopausal Women Menopause is a normal process in which your reproductive ability comes to an end. This process happens gradually over a span of months to years, usually between the ages of 61 and 45. Menopause is complete when you have missed 12 consecutive menstrual periods. It is important to talk with your health care provider about some of the most common conditions that affect postmenopausal women, such as heart disease, cancer, and bone loss (osteoporosis). Adopting a healthy lifestyle and getting preventive care can help to promote your health and wellness. Those actions can also lower your chances of developing some of these common conditions. What should I know about menopause? During menopause, you may experience a number of symptoms, such as:  Moderate-to-severe hot flashes.  Night sweats.  Decrease in sex drive.  Mood swings.  Headaches.  Tiredness.  Irritability.  Memory problems.  Insomnia.  Choosing to treat or not to treat menopausal changes is an individual decision that you make with your health care provider. What should I know about hormone replacement therapy and supplements? Hormone therapy products are effective for treating symptoms that are associated with menopause, such as hot flashes and night sweats. Hormone replacement carries certain risks, especially as you become older. If you are thinking about using estrogen or estrogen with progestin treatments, discuss the benefits and risks with your health care provider. What should I know about heart disease and stroke? Heart disease, heart attack, and stroke become more likely as you age. This may be due, in part, to the hormonal changes that your body experiences during menopause. These  can affect how your body processes dietary fats, triglycerides, and cholesterol. Heart attack and stroke are both medical emergencies. There are many things that you can do to help prevent heart disease and stroke:  Have your blood pressure checked at least every 1-2 years. High blood pressure causes heart disease and increases the risk of stroke.  If you are 17-32 years old, ask your health care provider if you should take aspirin to prevent a heart attack or a stroke.  Do not use any tobacco products, including cigarettes, chewing tobacco, or electronic cigarettes. If you need help quitting, ask your health care provider.  It is important to eat a healthy diet and maintain a healthy weight. ? Be sure to include plenty of vegetables, fruits, low-fat dairy products, and lean protein. ? Avoid eating foods that are high in solid fats, added sugars, or salt (sodium).  Get regular exercise. This is one of the most important things that you can do for your health. ? Try to exercise for at least 150 minutes each week. The type of exercise that you do should increase your heart rate and make you sweat. This is known as moderate-intensity exercise. ? Try to do strengthening exercises at least twice each week. Do these in addition to the moderate-intensity exercise.  Know your numbers.Ask your health care provider to check your cholesterol and your blood glucose. Continue to have your blood tested as directed by your health care provider.  What should I know about cancer screening? There are several types of cancer. Take the following steps to reduce your risk and to catch any cancer development as early as possible. Breast Cancer  Practice breast self-awareness. ? This means understanding how your breasts normally appear and feel. ? It also means doing regular breast self-exams. Let your health care provider know about any changes, no matter how small.  If you are 68 or older, have a clinician do  a breast exam (clinical breast exam or CBE) every year. Depending on your age, family history, and medical history, it may be recommended that you also have a yearly breast X-ray (mammogram).  If you have a family history of breast cancer, talk with your health care provider about genetic screening.  If you are at high risk for breast cancer, talk with your health care provider about having an MRI and a mammogram every year.  Breast cancer (BRCA) gene test is recommended for women who have family members with BRCA-related cancers. Results of the assessment will determine the need for genetic counseling and BRCA1 and for BRCA2 testing. BRCA-related cancers include these types: ? Breast. This occurs in males or females. ? Ovarian. ? Tubal. This may also be called fallopian tube cancer. ? Cancer of the abdominal or pelvic lining (peritoneal cancer). ? Prostate. ? Pancreatic.  Cervical, Uterine, and Ovarian Cancer Your health care provider may recommend that you be screened regularly for cancer of the pelvic organs. These include your ovaries, uterus, and vagina. This screening involves a pelvic exam, which includes checking for microscopic changes to the surface of your cervix (Pap test).  For women ages 21-65, health care providers may recommend a pelvic exam and a Pap test every three years. For women ages 31-65, they may recommend the Pap test and pelvic exam, combined with testing for human papilloma virus (HPV), every five years. Some types of HPV increase your risk of cervical cancer. Testing for HPV may also be done on women of any age who have unclear Pap test results.  Other health care providers may not recommend any screening for nonpregnant women who are considered low risk for pelvic cancer and have no symptoms. Ask your health care provider if a screening pelvic exam is right for you.  If you have had past treatment for cervical cancer or a condition that could lead to cancer, you  need Pap tests and screening for cancer for at least 20 years after your treatment. If Pap tests have been discontinued for you, your risk factors (such as having a new sexual partner) need to be reassessed to determine if you should start having screenings again. Some women have medical problems that increase the chance of getting cervical cancer. In these cases, your health care provider may recommend that you have screening and Pap tests more often.  If you have a family history of uterine cancer or ovarian cancer, talk with your health care provider about genetic screening.  If you have vaginal bleeding after reaching menopause, tell your health care provider.  There are currently no reliable tests available to screen for ovarian cancer.  Lung Cancer Lung cancer screening is recommended for adults 38-81 years old who are at high risk for lung cancer because of a history of smoking. A yearly low-dose CT scan of the lungs is recommended if you:  Currently smoke.  Have a history of at least 30 pack-years of smoking and you currently smoke or have quit within the past 15 years. A pack-year is smoking an average of one pack of cigarettes per day for one year.  Yearly screening should:  Continue until it has been 15 years since you quit.  Stop if you develop a health problem that would prevent you from having lung cancer treatment.  Colorectal Cancer  This type of cancer can be detected and can often be prevented.  Routine colorectal cancer screening usually begins at age 67 and continues through age 84.  If you have risk factors for colon cancer, your health care provider may recommend that you be screened at an earlier age.  If you have a family history of colorectal cancer, talk with your health care provider about genetic screening.  Your health care provider may also recommend using home test kits to check for hidden blood in your stool.  A small camera at the end of a tube can be  used to examine your colon directly (sigmoidoscopy or colonoscopy). This is done to check for the earliest forms of colorectal cancer.  Direct examination of the colon should be repeated every 5-10 years until age 33. However, if early forms of precancerous polyps or small growths are found or if you have a family history or genetic risk for colorectal cancer, you may need to be screened more often.  Skin Cancer  Check your skin from head to toe regularly.  Monitor any moles. Be sure to tell your health care provider: ? About any new moles or changes in moles, especially if there is a change in a mole's shape or color. ? If you have a mole that is larger than the size of a pencil eraser.  If any of your family members has a history of skin cancer, especially at a young age, talk with your health care provider about genetic screening.  Always use sunscreen. Apply sunscreen liberally and repeatedly throughout the day.  Whenever you are outside, protect yourself by wearing long sleeves, pants, a wide-brimmed hat, and sunglasses.  What should I know about osteoporosis? Osteoporosis is a condition in which bone destruction happens more quickly than new bone creation. After menopause, you may be at an increased risk for osteoporosis. To help prevent osteoporosis or the bone fractures that can happen because of osteoporosis, the following is recommended:  If you are 59-29 years old, get at least 1,000 mg of calcium and at least 600 mg of vitamin D per day.  If you are older than age 33 but younger than age 26, get at least 1,200 mg of calcium and at least 600 mg of vitamin D per day.  If you are older than age 53, get at least 1,200 mg of calcium and at least 800 mg of vitamin D per day.  Smoking and excessive alcohol intake increase the risk of osteoporosis. Eat foods that are rich in calcium and vitamin D, and do weight-bearing exercises several times each week as directed by your health care  provider. What should I know about how menopause affects my mental health? Depression may occur at any age, but it is more common as you become older. Common symptoms of depression include:  Low or sad mood.  Changes in sleep patterns.  Changes in appetite or eating patterns.  Feeling an overall lack of motivation or enjoyment of activities that you previously enjoyed.  Frequent crying spells.  Talk with your health care provider if you think that you are experiencing depression. What should I know about immunizations? It is important that you get and maintain your immunizations. These include:  Tetanus, diphtheria, and pertussis (Tdap) booster vaccine.  Influenza every year before the flu season begins.  Pneumonia vaccine.  Shingles vaccine.  Your  health care provider may also recommend other immunizations. This information is not intended to replace advice given to you by your health care provider. Make sure you discuss any questions you have with your health care provider. Document Released: 05/25/2005 Document Revised: 10/21/2015 Document Reviewed: 01/04/2015 Elsevier Interactive Patient Education  2018 [employer]vier [employer]  

## 2017-08-16 LAB — CYTOLOGY - PAP
Adequacy: ABSENT
Diagnosis: NEGATIVE
HPV: NOT DETECTED

## 2017-08-16 LAB — HEPATITIS C ANTIBODY
Hepatitis C Ab: NONREACTIVE
SIGNAL TO CUT-OFF: 0.03 (ref <1.00)

## 2017-08-16 LAB — HIV ANTIBODY (ROUTINE TESTING W REFLEX): HIV: NONREACTIVE

## 2017-08-17 DIAGNOSIS — R7303 Prediabetes: Secondary | ICD-10-CM | POA: Insufficient documentation

## 2017-08-17 DIAGNOSIS — G4700 Insomnia, unspecified: Secondary | ICD-10-CM | POA: Insufficient documentation

## 2017-08-17 NOTE — Assessment & Plan Note (Signed)
Patient appears to have had a total hysterectomy as oppose d to a supracervical hysterectomy

## 2017-08-17 NOTE — Assessment & Plan Note (Signed)
Trial of simvastatin  Was advised at last visit for  risk of CAD > 7.5%.  She has not taken the medication  In over 4 weeks and current risk is 7.8%  .    Lab Results  Component Value Date   CHOL 247 (H) 08/15/2017   HDL 58.80 08/15/2017   LDLCALC 170 (H) 08/15/2017   TRIG 95.0 08/15/2017   CHOLHDL 4 08/15/2017

## 2017-08-17 NOTE — Assessment & Plan Note (Signed)
Thyroid function is slightly underactive.  Wiil repeat in 6 weeks l.  Lab Results  Component Value Date   TSH 5.18 (H) 08/15/2017

## 2017-08-17 NOTE — Assessment & Plan Note (Signed)
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

## 2017-08-17 NOTE — Assessment & Plan Note (Signed)
Hesr random glucose is again elevated but not diagnostic of diabetes .  I recommend he follow a low glycemic index diet and particpate regularly in an aerobic  exercise activity.  We should check an A1c in 6 months.

## 2017-08-17 NOTE — Assessment & Plan Note (Signed)
Annual comprehensive preventive exam was done as well as an evaluation and management of chronic conditions .  During the course of the visit the patient was educated and counseled about appropriate screening and preventive services including :  diabetes screening, lipid analysis with projected  10 year  risk for CAD , nutrition counseling, breast, cervical and colorectal cancer screening, and recommended immunizations.  Printed recommendations for health maintenance screenings was given  Lab Results  Component Value Date   HGBA1C 5.9 08/15/2017

## 2017-08-19 ENCOUNTER — Telehealth: Payer: Self-pay

## 2017-08-19 NOTE — Telephone Encounter (Signed)
Gastroenterology Pre-Procedure Review  Request Date: 09/19/17 Requesting Physician: Dr. Allegra Lai    PATIENT REVIEW QUESTIONS: The patient responded to the following health history questions as indicated:    1. Are you having any GI issues? No  2. Do you have a personal history of Polyps? No  3. Do you have a family history of Colon Cancer or Polyps? No  4. Diabetes Mellitus? No  5. Joint replacements in the past 12 months? No  6. Major health problems in the past 3 months? No  7. Any artificial heart valves, MVP, or defibrillator? No     MEDICATIONS & ALLERGIES:    Patient reports the following regarding taking any anticoagulation/antiplatelet therapy:   Plavix, Coumadin, Eliquis, Xarelto, Lovenox, Pradaxa, Brilinta, or Effient? No  Aspirin? No   Patient confirms/reports the following medications:  Current Outpatient Medications  Medication Sig Dispense Refill  . traZODone (DESYREL) 50 MG tablet Take 0.5-1 tablets (25-50 mg total) by mouth at bedtime as needed for sleep. 30 tablet 3   No current facility-administered medications for this visit.     Patient confirms/reports the following allergies:  Allergies  Allergen Reactions  . Aspirin     GI upset    No orders of the defined types were placed in this encounter.   AUTHORIZATION INFORMATION Primary Insurance: 1D#: Group #:  Secondary Insurance: 1D#: Group #:  SCHEDULE INFORMATION: Date: 09/19/17  Time: Location: [facility]

## 2017-08-21 ENCOUNTER — Other Ambulatory Visit: Payer: Self-pay

## 2017-08-21 DIAGNOSIS — Z1211 Encounter for screening for malignant neoplasm of colon: Secondary | ICD-10-CM

## 2017-08-22 ENCOUNTER — Telehealth: Payer: Self-pay | Admitting: Gastroenterology

## 2017-08-22 NOTE — Telephone Encounter (Signed)
[facility] left vm for Traci no prior Berkley Harvey is required for online request [phone]

## 2017-08-23 ENCOUNTER — Other Ambulatory Visit: Payer: Self-pay

## 2017-08-23 MED ORDER — NA SULFATE-K SULFATE-MG SULF 17.5-3.13-1.6 GM/177ML PO SOLN: 1.0000 | Freq: Once | ORAL | 0 refills | Status: AC

## 2017-09-17 ENCOUNTER — Ambulatory Visit
Admission: RE | Admit: 2017-09-17 | Discharge: 2017-09-17 | Disposition: A | Discharge status: Home / Self Care | Payer: 59 | Source: Ambulatory Visit | Attending: Internal Medicine | Admitting: Internal Medicine

## 2017-09-17 ENCOUNTER — Other Ambulatory Visit: Payer: Self-pay | Admitting: Internal Medicine

## 2017-09-17 DIAGNOSIS — Z1239 Encounter for other screening for malignant neoplasm of breast: Secondary | ICD-10-CM

## 2017-09-17 DIAGNOSIS — N6310 Unspecified lump in the right breast, unspecified quadrant: Secondary | ICD-10-CM

## 2017-09-17 DIAGNOSIS — Z1231 Encounter for screening mammogram for malignant neoplasm of breast: Secondary | ICD-10-CM | POA: Insufficient documentation

## 2017-09-17 DIAGNOSIS — R928 Other abnormal and inconclusive findings on diagnostic imaging of breast: Secondary | ICD-10-CM

## 2017-09-19 ENCOUNTER — Ambulatory Visit
Admission: RE | Admit: 2017-09-19 | Discharge: 2017-09-19 | Disposition: A | Discharge status: Home / Self Care | Payer: 59 | Source: Ambulatory Visit | Attending: Gastroenterology | Admitting: Gastroenterology

## 2017-09-19 ENCOUNTER — Encounter: Payer: Self-pay | Admitting: *Deleted

## 2017-09-19 ENCOUNTER — Other Ambulatory Visit: Payer: Self-pay

## 2017-09-19 ENCOUNTER — Encounter
Admission: RE | Disposition: A | Discharge status: Home / Self Care | Payer: Self-pay | Source: Ambulatory Visit | Attending: Gastroenterology

## 2017-09-19 ENCOUNTER — Ambulatory Visit: Payer: 59 | Admitting: Certified Registered Nurse Anesthetist

## 2017-09-19 DIAGNOSIS — Z1211 Encounter for screening for malignant neoplasm of colon: Secondary | ICD-10-CM

## 2017-09-19 DIAGNOSIS — K5730 Diverticulosis of large intestine without perforation or abscess without bleeding: Secondary | ICD-10-CM | POA: Insufficient documentation

## 2017-09-19 DIAGNOSIS — K5790 Diverticulosis of intestine, part unspecified, without perforation or abscess without bleeding: Secondary | ICD-10-CM | POA: Diagnosis not present

## 2017-09-19 DIAGNOSIS — K644 Residual hemorrhoidal skin tags: Secondary | ICD-10-CM | POA: Insufficient documentation

## 2017-09-19 DIAGNOSIS — K648 Other hemorrhoids: Secondary | ICD-10-CM | POA: Diagnosis not present

## 2017-09-19 HISTORY — PX: COLONOSCOPY WITH PROPOFOL: SHX5780

## 2017-09-19 SURGERY — COLONOSCOPY WITH PROPOFOL
Anesthesia: General

## 2017-09-19 MED ORDER — PROPOFOL 10 MG/ML IV BOLUS
INTRAVENOUS | Status: DC | PRN
  Administered 2017-09-19: 20 mg via INTRAVENOUS
  Administered 2017-09-19: 50 mg via INTRAVENOUS

## 2017-09-19 MED ORDER — LIDOCAINE HCL (PF) 2 % IJ SOLN
INTRAMUSCULAR | Status: AC
  Filled 2017-09-19: qty 10

## 2017-09-19 MED ORDER — LIDOCAINE HCL (CARDIAC) PF 100 MG/5ML IV SOSY
PREFILLED_SYRINGE | INTRAVENOUS | Status: DC | PRN
  Administered 2017-09-19: 50 mg via INTRAVENOUS

## 2017-09-19 MED ORDER — SODIUM CHLORIDE 0.9 % IV SOLN
INTRAVENOUS | Status: DC
  Administered 2017-09-19: 08:00:00 via INTRAVENOUS

## 2017-09-19 MED ORDER — PROPOFOL 500 MG/50ML IV EMUL
INTRAVENOUS | Status: DC | PRN
  Administered 2017-09-19: 180 ug/kg/min via INTRAVENOUS

## 2017-09-19 MED ORDER — PROPOFOL 500 MG/50ML IV EMUL
INTRAVENOUS | Status: AC
Start: 2017-09-19 — End: ?
  Filled 2017-09-19: qty 50

## 2017-09-19 NOTE — Anesthesia Procedure Notes (Signed)
Date/Time: 09/19/2017 8:58 AM Performed by: Ginger CarneMichelet, Sricharan Lacomb, CRNA Pre-anesthesia Checklist: Patient identified, Emergency Drugs available, Suction available, Patient being monitored and Timeout performed Patient Re-evaluated:Patient Re-evaluated prior to induction Oxygen Delivery Method: Nasal cannula Preoxygenation: Pre-oxygenation with 100% oxygen

## 2017-09-19 NOTE — H&P (Signed)
Arlyss Repress, MD [address]  [address]  [place], [place] [address]  Main: [phone]  Fax: [phone] Pager: [phone]  Primary Care Physician:  Sherlene Shams, MD Primary Gastroenterologist:  Dr. Arlyss Repress  Pre-Procedure History & Physical: HPI:  Sandra Russo is a 62 y.o. female is here for an colonoscopy.   Past Medical History:  Diagnosis Date  . Acquired lymphedema of leg    right leg  . Dysphagia    treated with esophgeal dilation at [facility]  . Esophageal dysphagia   . multinodular goiter     Past Surgical History:  Procedure Laterality Date  . ABDOMINAL HYSTERECTOMY  1984  . CHOLECYSTECTOMY  Oct 2013   [facility]    Prior to Admission medications   Medication Sig Start Date End Date Taking? Authorizing Provider  traZODone (DESYREL) 50 MG tablet Take 0.5-1 tablets (25-50 mg total) by mouth at bedtime as needed for sleep. 08/15/17  Yes Sherlene Shams, MD    Allergies as of 08/21/2017 - Review Complete 08/17/2017  Allergen Reaction Noted  . Aspirin  08/20/2011    Family History  Problem Relation Age of Onset  . Hypertension Mother   . Hypertension Father     Social History   Socioeconomic History  . Marital status: Married    Spouse name: Not on file  . Number of children: Not on file  . Years of education: Not on file  . Highest education level: Not on file  Occupational History  . Not on file  Social Needs  . Financial resource strain: Not on file  . Food insecurity:    Worry: Not on file    Inability: Not on file  . Transportation needs:    Medical: Not on file    Non-medical: Not on file  Tobacco Use  . Smoking status: Never Smoker  . Smokeless tobacco: Never Used  Substance and Sexual Activity  . Alcohol use: No  . Drug use: No  . Sexual activity: Not on file  Lifestyle  . Physical activity:    Days per week: Not on file    Minutes per session: Not on file  . Stress: Not on file  Relationships  . Social connections:      Talks on phone: Not on file    Gets together: Not on file    Attends religious service: Not on file    Active member of club or organization: Not on file    Attends meetings of clubs or organizations: Not on file    Relationship status: Not on file  . Intimate partner violence:    Fear of current or ex partner: Not on file    Emotionally abused: Not on file    Physically abused: Not on file    Forced sexual activity: Not on file  Other Topics Concern  . Not on file  Social History Narrative  . Not on file    Review of Systems: See HPI, otherwise negative ROS  Physical Exam: BP (!) 162/80   Pulse 85   Temp 97.7 F (36.5 C) (Tympanic)   Resp 16   Ht 5\' 4"  (1.626 m)   Wt 200 lb (90.7 kg)   SpO2 99%   BMI 34.33 kg/m  General:   Alert,  pleasant and cooperative in NAD Head:  Normocephalic and atraumatic. Neck:  Supple; no masses or thyromegaly. Lungs:  Clear throughout to auscultation.    Heart:  Regular rate and rhythm. Abdomen:  Soft, nontender and nondistended. Normal bowel sounds, without guarding, and without rebound.   Neurologic:  Alert and  oriented x4;  grossly normal neurologically.  Impression/Plan: Sandra Russo is here for an colonoscopy to be performed for colon cancer screening  Risks, benefits, limitations, and alternatives regarding  colonoscopy have been reviewed with the patient.  Questions have been answered.  All parties agreeable.   Sandra Donathohini Kavya Haag, MD  09/19/2017, 8:59 AM

## 2017-09-19 NOTE — Op Note (Signed)
[facility] Gastroenterology Patient Name: Sandra Russo Procedure Date: 09/19/2017 8:56 AM MRN: [id number] Account #: [id number] Date of Birth: January 26, 1956 Admit Type: Outpatient Age: 62 Room: [facility] ENDO ROOM 2 Gender: Female Note Status: Finalized Procedure:            Colonoscopy Indications:          Screening for colorectal malignant neoplasm, Last                        colonoscopy: June 2013 Providers:            Lin Landsman MD, MD Referring MD:         Deborra Medina, MD (Referring MD) Medicines:            Monitored Anesthesia Care Complications:        No immediate complications. Estimated blood loss: None. Procedure:            Pre-Anesthesia Assessment:                       - Prior to the procedure, a History and Physical was                        performed, and patient medications and allergies were                        reviewed. The patient is competent. The risks and                        benefits of the procedure and the sedation options and                        risks were discussed with the patient. All questions                        were answered and informed consent was obtained.                        Patient identification and proposed procedure were                        verified by the physician, the nurse, the                        anesthesiologist, the anesthetist and the technician in                        the pre-procedure area in the procedure room in the                        endoscopy suite. Mental Status Examination: alert and                        oriented. Airway Examination: normal oropharyngeal                        airway and neck mobility. Respiratory Examination:                        clear to auscultation. CV Examination: normal.  Prophylactic Antibiotics: The patient does not require                        prophylactic antibiotics. Prior Anticoagulants: The   patient has taken no previous anticoagulant or                        antiplatelet agents. ASA Grade Assessment: II - A                        patient with mild systemic disease. After reviewing the                        risks and benefits, the patient was deemed in                        satisfactory condition to undergo the procedure. The                        anesthesia plan was to use monitored anesthesia care                        (MAC). Immediately prior to administration of                        medications, the patient was re-assessed for adequacy                        to receive sedatives. The heart rate, respiratory rate,                        oxygen saturations, blood pressure, adequacy of                        pulmonary ventilation, and response to care were                        monitored throughout the procedure. The physical status                        of the patient was re-assessed after the procedure.                       After obtaining informed consent, the colonoscope was                        passed under direct vision. Throughout the procedure,                        the patient's blood pressure, pulse, and oxygen                        saturations were monitored continuously. The                        Colonoscope was introduced through the anus and                        advanced to the the cecum, identified by appendiceal  orifice and ileocecal valve. The colonoscopy was                        performed without difficulty. The patient tolerated the                        procedure well. The quality of the bowel preparation                        was evaluated using the BBPS [facility] Bowel Preparation                        Scale) with scores of: Right Colon = 3, Transverse                        Colon = 3 and Left Colon = 3 (entire mucosa seen well                        with no residual staining, small fragments of stool or                         opaque liquid). The total BBPS score equals 9. Findings:      The perianal and digital rectal examinations were normal. Pertinent       negatives include normal sphincter tone and no palpable rectal lesions.      A few diverticula were found in the sigmoid colon.      External hemorrhoids were found during retroflexion. The hemorrhoids       were medium-sized.      The entire examined colon appeared normal. Impression:           - Diverticulosis in the sigmoid colon.                       - External hemorrhoids.                       - The entire examined colon is normal.                       - No specimens collected. Recommendation:       - Discharge patient to home.                       - Resume previous diet today.                       - Continue present medications.                       - Repeat colonoscopy in 10 years for surveillance. Procedure Code(s):    --- Professional ---                       O1157, Colorectal cancer screening; colonoscopy on                        individual not meeting criteria for high risk Diagnosis Code(s):    --- Professional ---                       Z12.11, Encounter for screening for  malignant neoplasm                        of colon                       K64.4, Residual hemorrhoidal skin tags                       K57.30, Diverticulosis of large intestine without                        perforation or abscess without bleeding CPT copyright 2017 American Medical Association. All rights reserved. The codes documented in this report are preliminary and upon coder review may  be revised to meet current compliance requirements. Dr. Ulyess Mort Lin Landsman MD, MD 09/19/2017 9:19:01 AM This report has been signed electronically. Number of Addenda: 0 Note Initiated On: 09/19/2017 8:56 AM Scope Withdrawal Time: 0 hours 9 minutes 8 seconds  Total Procedure Duration: 0 hours 13 minutes 23 seconds       [facility]

## 2017-09-19 NOTE — Transfer of Care (Signed)
Immediate Anesthesia Transfer of Care Note  Patient: Sandra Russo  Procedure(s) Performed: COLONOSCOPY WITH PROPOFOL (N/A )  Patient Location: PACU  Anesthesia Type:General  Level of Consciousness: sedated  Airway & Oxygen Therapy: Patient Spontanous Breathing and Patient connected to nasal cannula oxygen  Post-op Assessment: Report given to RN and Post -op Vital signs reviewed and stable  Post vital signs: Reviewed and stable  Last Vitals:  Vitals Value Taken Time  BP 142/74 09/19/2017  9:22 AM  Temp 36.1 C 09/19/2017  9:20 AM  Pulse 68 09/19/2017  9:25 AM  Resp 16 09/19/2017  9:25 AM  SpO2 100 % 09/19/2017  9:25 AM  Vitals shown include unvalidated device data.  Last Pain:  Vitals:   09/19/17 0920  TempSrc: Tympanic  PainSc:          Complications: No apparent anesthesia complications

## 2017-09-19 NOTE — Anesthesia Preprocedure Evaluation (Signed)
Anesthesia Evaluation  Patient identified by MRN, date of birth, ID band Patient awake    Reviewed: Allergy & Precautions, H&P , NPO status , Patient's Chart, lab work & pertinent test results  History of Anesthesia Complications Negative for: history of anesthetic complications  Airway Mallampati: III  TM Distance: <3 FB Neck ROM: limited    Dental  (+) Chipped, Poor Dentition, Missing, Partial Upper   Pulmonary neg pulmonary ROS, neg shortness of breath,           Cardiovascular Exercise Tolerance: Good (-) angina(-) Past MI negative cardio ROS       Neuro/Psych negative neurological ROS  negative psych ROS   GI/Hepatic negative GI ROS, Neg liver ROS, neg GERD  ,  Endo/Other  negative endocrine ROS  Renal/GU negative Renal ROS  negative genitourinary   Musculoskeletal   Abdominal   Peds  Hematology negative hematology ROS (+)   Anesthesia Other Findings Past Medical History: No date: Acquired lymphedema of leg     Comment:  right leg No date: Dysphagia     Comment:  [facility] No date: Esophageal dysphagia No date: multinodular goiter  Past Surgical History: 1984: ABDOMINAL HYSTERECTOMY Oct 2013: CHOLECYSTECTOMY     Comment:  [facility]  BMI    Body Mass Index:  34.33 kg/m      Reproductive/Obstetrics negative OB ROS                             Anesthesia Physical Anesthesia Plan  ASA: II  Anesthesia Plan: General   Post-op Pain Management:    Induction: Intravenous  PONV Risk Score and Plan: Propofol infusion and TIVA  Airway Management Planned: Natural Airway and Nasal Cannula  Additional Equipment:   Intra-op Plan:   Post-operative Plan:   Informed Consent: I have reviewed the patients History and Physical, chart, labs and discussed the procedure including the risks, benefits and alternatives for the proposed anesthesia with the  patient or authorized representative who has indicated his/her understanding and acceptance.   Dental Advisory Given  Plan Discussed with: Anesthesiologist, CRNA and Surgeon  Anesthesia Plan Comments: (Patient consented for risks of anesthesia including but not limited to:  - adverse reactions to medications - risk of intubation if required - damage to teeth, lips or other oral mucosa - sore throat or hoarseness - Damage to heart, brain, lungs or loss of life  Patient voiced understanding.)        Anesthesia Quick Evaluation

## 2017-09-19 NOTE — Anesthesia Post-op Follow-up Note (Signed)
Anesthesia QCDR form completed.        

## 2017-09-19 NOTE — Anesthesia Postprocedure Evaluation (Signed)
Anesthesia Post Note  Patient: Sandra Russo  Procedure(s) Performed: COLONOSCOPY WITH PROPOFOL (N/A )  Patient location during evaluation: Endoscopy Anesthesia Type: General Level of consciousness: awake and alert Pain management: pain level controlled Vital Signs Assessment: post-procedure vital signs reviewed and stable Respiratory status: spontaneous breathing, nonlabored ventilation, respiratory function stable and patient connected to nasal cannula oxygen Cardiovascular status: blood pressure returned to baseline and stable Postop Assessment: no apparent nausea or vomiting Anesthetic complications: no     Last Vitals:  Vitals:   09/19/17 0940 09/19/17 0950  BP: 127/67 128/71  Pulse: 73 67  Resp: 13 16  Temp:    SpO2: 98% 97%    Last Pain:  Vitals:   09/19/17 0920  TempSrc: Tympanic  PainSc:                  Cleda MccreedyJoseph K Piscitello

## 2017-09-19 NOTE — Addendum Note (Signed)
Addendum  created 09/19/17 1101 by Piscitello, Cleda MccreedyJoseph K, MD   Intraprocedure Staff edited

## 2017-09-19 NOTE — Addendum Note (Signed)
Addendum  created 09/19/17 1056 by Sharetha Newson, Cleda MccreedyJoseph K, MD   Intraprocedure Event edited, Intraprocedure Staff edited

## 2017-09-20 ENCOUNTER — Encounter: Payer: Self-pay | Admitting: Gastroenterology

## 2017-09-27 ENCOUNTER — Ambulatory Visit
Admission: RE | Admit: 2017-09-27 | Discharge: 2017-09-27 | Disposition: A | Discharge status: Home / Self Care | Payer: 59 | Source: Ambulatory Visit | Attending: Internal Medicine | Admitting: Internal Medicine

## 2017-09-27 DIAGNOSIS — R928 Other abnormal and inconclusive findings on diagnostic imaging of breast: Secondary | ICD-10-CM

## 2017-09-27 DIAGNOSIS — N6310 Unspecified lump in the right breast, unspecified quadrant: Secondary | ICD-10-CM

## 2017-09-27 DIAGNOSIS — N6489 Other specified disorders of breast: Secondary | ICD-10-CM | POA: Diagnosis not present

## 2018-01-22 ENCOUNTER — Ambulatory Visit: Payer: 59 | Admitting: Family Medicine

## 2018-01-22 ENCOUNTER — Ambulatory Visit (INDEPENDENT_AMBULATORY_CARE_PROVIDER_SITE_OTHER): Payer: 59

## 2018-01-22 ENCOUNTER — Encounter: Payer: Self-pay | Admitting: Family Medicine

## 2018-01-22 VITALS — BP 128/86 | HR 66 | Temp 97.7°F | Ht 64.0 in | Wt 200.8 lb

## 2018-01-22 DIAGNOSIS — M62838 Other muscle spasm: Secondary | ICD-10-CM | POA: Diagnosis not present

## 2018-01-22 DIAGNOSIS — K219 Gastro-esophageal reflux disease without esophagitis: Secondary | ICD-10-CM | POA: Diagnosis not present

## 2018-01-22 DIAGNOSIS — M19011 Primary osteoarthritis, right shoulder: Secondary | ICD-10-CM | POA: Diagnosis not present

## 2018-01-22 DIAGNOSIS — R071 Chest pain on breathing: Secondary | ICD-10-CM

## 2018-01-22 DIAGNOSIS — R079 Chest pain, unspecified: Secondary | ICD-10-CM

## 2018-01-22 DIAGNOSIS — M25511 Pain in right shoulder: Secondary | ICD-10-CM | POA: Diagnosis not present

## 2018-01-22 LAB — CBC WITH DIFFERENTIAL/PLATELET
BASOS PCT: 0.3 % (ref 0.0–3.0)
Basophils Absolute: 0 10*3/uL (ref 0.0–0.1)
EOS PCT: 1.8 % (ref 0.0–5.0)
Eosinophils Absolute: 0.2 10*3/uL (ref 0.0–0.7)
HEMATOCRIT: 39.1 % (ref 36.0–46.0)
Hemoglobin: 13.1 g/dL (ref 12.0–15.0)
LYMPHS ABS: 3.6 10*3/uL (ref 0.7–4.0)
Lymphocytes Relative: 42.7 % (ref 12.0–46.0)
MCHC: 33.6 g/dL (ref 30.0–36.0)
MCV: 86.5 fl (ref 78.0–100.0)
Monocytes Absolute: 0.6 10*3/uL (ref 0.1–1.0)
Monocytes Relative: 6.6 % (ref 3.0–12.0)
NEUTROS ABS: 4.1 10*3/uL (ref 1.4–7.7)
NEUTROS PCT: 48.6 % (ref 43.0–77.0)
PLATELETS: 364 10*3/uL (ref 150.0–400.0)
RBC: 4.52 Mil/uL (ref 3.87–5.11)
RDW: 14.2 % (ref 11.5–15.5)
WBC: 8.4 10*3/uL (ref 4.0–10.5)

## 2018-01-22 LAB — COMPREHENSIVE METABOLIC PANEL
ALT: 15 U/L (ref 0–35)
AST: 14 U/L (ref 0–37)
Albumin: 4.3 g/dL (ref 3.5–5.2)
Alkaline Phosphatase: 92 U/L (ref 39–117)
BUN: 8 mg/dL (ref 6–23)
CALCIUM: 9.5 mg/dL (ref 8.4–10.5)
CHLORIDE: 105 meq/L (ref 96–112)
CO2: 29 mEq/L (ref 19–32)
Creatinine, Ser: 0.82 mg/dL (ref 0.40–1.20)
GFR: 90.86 mL/min (ref >60.00)
GLUCOSE: 104 mg/dL — AB (ref 70–99)
POTASSIUM: 4.3 meq/L (ref 3.5–5.1)
Sodium: 141 mEq/L (ref 135–145)
Total Bilirubin: 0.5 mg/dL (ref 0.2–1.2)
Total Protein: 7.7 g/dL (ref 6.0–8.3)

## 2018-01-22 MED ORDER — OMEPRAZOLE 20 MG PO CPDR: 20.0000 mg | DELAYED_RELEASE_CAPSULE | Freq: Every day | ORAL | 3 refills | Status: AC

## 2018-01-22 MED ORDER — CYCLOBENZAPRINE HCL 5 MG PO TABS: 5.0000 mg | ORAL_TABLET | Freq: Three times a day (TID) | ORAL | 0 refills | Status: DC | PRN

## 2018-01-22 MED ORDER — METHYLPREDNISOLONE 4 MG PO TBPK: ORAL_TABLET | ORAL | 0 refills | Status: DC

## 2018-01-22 NOTE — Progress Notes (Signed)
Subjective:    Patient ID: Sandra Russo, female    DOB: 25-Mar-1956, 61 y.o.   MRN: [id number]  HPI   Patient presents to clinic with right shoulder pain, pain in right shoulder blade when taking a deep breath for about a week.  Denies any known injury to shoulder.  States she took some Tylenol and this did help some.  Denies any pain with range of motion of arm/shoulder, mostly notices pain behind right shoulder blade when taking deep breath.  Denies fever or chills.  Denies any palpitations or racing heart.  Denies any shortness of breath, cough or wheezing.  Patient also reports feelings of acid reflux.  Patient has a history of heartburn, but had gallbladder removed and this seemed to improve it for a period of time.  Patient states she is starting to have burning sensation in chest/back of throat, belching more.  Patient Active Problem List   Diagnosis Date Noted  . Encounter for screening colonoscopy   . Prediabetes 08/17/2017  . Insomnia 08/17/2017  . Visit for preventive health examination 10/05/2014  . S/P hysterectomy 10/04/2014  . S/P laparoscopic cholecystectomy 10/04/2014  . IBS (irritable colon syndrome) 08/31/2014  . Obesity 08/30/2014  . Chest pain 08/30/2014  . Hyperlipidemia with target LDL less than 160 08/21/2011  . Esophageal dysphagia   . Multinodular goiter 08/20/2011  . Acquired lymphedema of leg    Social History   Tobacco Use  . Smoking status: Never Smoker  . Smokeless tobacco: Never Used  Substance Use Topics  . Alcohol use: No   Review of Systems  Constitutional: Negative for chills, fatigue and fever.  HENT: Negative for congestion, ear pain, sinus pain and sore throat.   Eyes: Negative.   Respiratory: Negative for cough, shortness of breath and wheezing.   Cardiovascular: Negative for chest pain, palpitations and leg swelling.  Gastrointestinal: +heart burn. Negative for abdominal pain, diarrhea, nausea and vomiting.  Genitourinary:  Negative for dysuria, frequency and urgency.  Musculoskeletal: Right shoulder pain/pain right shoulder blade-back of chest with deep breath.  Skin: Negative for color change, pallor and rash.  Neurological: Negative for syncope, light-headedness and headaches.  Psychiatric/Behavioral: The patient is not nervous/anxious.    Objective:   Physical Exam  Constitutional: She is oriented to person, place, and time. She appears well-nourished. No distress.  HENT:  Head: Normocephalic and atraumatic.  Eyes: EOM are normal. No scleral icterus.  Neck: Neck supple. No JVD present. No tracheal deviation present.  Cardiovascular: Normal rate, regular rhythm and normal heart sounds. Exam reveals no gallop and no friction rub.  No murmur heard. Pulmonary/Chest: Effort normal and breath sounds normal. No stridor. No respiratory distress. She has no wheezes. She has no rales.  Abdominal: Soft. Bowel sounds are normal. She exhibits no distension.  Mild epigastric tenderness  Musculoskeletal: Normal range of motion. She exhibits tenderness. She exhibits no deformity.       Arms: ROM shoulder intact, no pain induced with shoulder movement. Area of tenderness on back of chest indicated by red circle in diagram.   Neurological: She is alert and oriented to person, place, and time.  Skin: Skin is warm and dry. No rash noted. She is not diaphoretic. No pallor.  Psychiatric: She has a normal mood and affect. Her behavior is normal.  Nursing note and vitals reviewed.     Vitals:   01/22/18 0805  BP: 128/86  Pulse: 66  Temp: 97.7 F (36.5 C)  SpO2: 97%  Assessment & Plan:   Acute pain of right shoulder/muscle spasm/chest pain musculoskeletal/inspiratory pain -- we will get x-ray of both right shoulder and chest due to area patient is having pain and pain in right back of chest on the shoulder blade when taking a deep breath in.  Due to me being able to reproduce pain with palpation, I suspect it is a  muscle spasm rather than cardiac related.  Patient will use Flexeril as needed for muscle spasm.  She will also take Medrol Dosepak steroid taper down.  We will also get CBC and CMP lab work collected today to rule out any anemia or electrolyte abnormalities.  GERD - patient will start omeprazole 20 mg once daily.  CMP will tell us liver and kidney functions.   Follow up in 4 weeks to assess symptoms after adding omeprazole and to be sure shoulder pain has improved.

## 2018-01-23 ENCOUNTER — Encounter: Payer: Self-pay | Admitting: *Deleted

## 2018-01-27 ENCOUNTER — Other Ambulatory Visit: Payer: Self-pay

## 2018-01-27 ENCOUNTER — Ambulatory Visit
Admission: EM | Admit: 2018-01-27 | Discharge: 2018-01-27 | Disposition: A | Discharge status: Home / Self Care | Payer: 59 | Attending: Family Medicine | Admitting: Family Medicine

## 2018-01-27 ENCOUNTER — Encounter: Payer: Self-pay | Admitting: Emergency Medicine

## 2018-01-27 DIAGNOSIS — N390 Urinary tract infection, site not specified: Secondary | ICD-10-CM | POA: Diagnosis not present

## 2018-01-27 LAB — URINALYSIS, COMPLETE (UACMP) WITH MICROSCOPIC
Bacteria, UA: NONE SEEN
Bilirubin Urine: NEGATIVE
GLUCOSE, UA: NEGATIVE mg/dL
Ketones, ur: NEGATIVE mg/dL
NITRITE: NEGATIVE
Protein, ur: NEGATIVE mg/dL
Specific Gravity, Urine: 1.015 (ref 1.005–1.030)
Squamous Epithelial / HPF: NONE SEEN (ref 0–5)
pH: 5.5 (ref 5.0–8.0)

## 2018-01-27 MED ORDER — CEPHALEXIN 500 MG PO CAPS: 500.0000 mg | ORAL_CAPSULE | Freq: Two times a day (BID) | ORAL | 0 refills | Status: DC

## 2018-01-27 NOTE — ED Provider Notes (Addendum)
[facility]    CSN: [id number] Arrival date & time: 01/27/18  0945     History   Chief Complaint Chief Complaint  Patient presents with  . Dysuria  . bladder pressure  . Urinary Frequency    HPI Sandra Russo is a 62 y.o. female.   HPI  62 year old female presents with dusuria, as a burning pain, bladder pressure and frequency with  satiety for 4 days.  Not had fever.  Afebrile today.  Is recently been treated for muscle spasm under her shoulder blade and was on Flexeril and prednisone taper and was wondering if this may have caused her UTI.  States that she took cranberry juice which helped alleviate her symptoms to a degree but last night had symptoms that were worse and are somewhat better today.  Denies any nausea or vomiting chills or fever.  Denies vaginal discharge        Past Medical History:  Diagnosis Date  . Acquired lymphedema of leg    right leg  . Dysphagia    treated with es[facility]geal dilation at [facility]  . Esophageal dysphagia   . multinodular goiter     Patient Active Problem List   Diagnosis Date Noted  . Encounter for screening colonoscopy   . Prediabetes 08/17/2017  . Insomnia 08/17/2017  . Visit for preventive health examination 10/05/2014  . S/P hysterectomy 10/04/2014  . S/P laparoscopic cholecystectomy 10/04/2014  . IBS (irritable colon syndrome) 08/31/2014  . Obesity 08/30/2014  . Chest pain 08/30/2014  . Hyperlipidemia with target LDL less than 160 08/21/2011  . Esophageal dysphagia   . Multinodular goiter 08/20/2011  . Acquired lymphedema of leg     Past Surgical History:  Procedure Laterality Date  . ABDOMINAL HYSTERECTOMY  1984  . CHOLECYSTECTOMY  Oct 2013   [facility]  . COLONOSCOPY WITH PROPOFOL N/A 09/19/2017   Procedure: COLONOSCOPY WITH PROPOFOL;  Surgeon: Toney Reil, MD;  Location: [facility] ENDOSCOPY;  Service: Gastroenterology;  Laterality: N/A;    OB History   None      Home Medications    Prior to  Admission medications   Medication Sig Start Date End Date Taking? Authorizing Provider  cyclobenzaprine (FLEXERIL) 5 MG tablet Take 1 tablet (5 mg total) by mouth 3 (three) times daily as needed for muscle spasms. 01/22/18  Yes Guse, Janna Arch, FNP  methylPREDNISolone (MEDROL DOSEPAK) 4 MG TBPK tablet Take according to pack instructions 01/22/18  Yes Guse, Janna Arch, FNP  omeprazole (PRILOSEC) 20 MG capsule Take 1 capsule (20 mg total) by mouth daily. 01/22/18  Yes Guse, Janna Arch, FNP  traZODone (DESYREL) 50 MG tablet Take 0.5-1 tablets (25-50 mg total) by mouth at bedtime as needed for sleep. 08/15/17  Yes Sherlene Shams, MD  cephALEXin (KEFLEX) 500 MG capsule Take 1 capsule (500 mg total) by mouth 2 (two) times daily. 01/27/18   Lutricia Feil, PA-C    Family History Family History  Problem Relation Age of Onset  . Hypertension Mother   . Hypertension Father     Social History Social History   Tobacco Use  . Smoking status: Never Smoker  . Smokeless tobacco: Never Used  Substance Use Topics  . Alcohol use: No  . Drug use: No     Allergies   Aspirin   Review of Systems Review of Systems  Constitutional: Positive for activity change. Negative for chills, fatigue and fever.  Genitourinary: Positive for dysuria, frequency and urgency. Negative for hematuria, vaginal  bleeding and vaginal discharge.  All other systems reviewed and are negative.    Physical Exam Triage Vital Signs ED Triage Vitals  Enc Vitals Group     BP 01/27/18 0958 (!) 145/70     Pulse Rate 01/27/18 0958 73     Resp 01/27/18 0958 16     Temp 01/27/18 0958 97.9 F (36.6 C)     Temp Source 01/27/18 0958 Oral     SpO2 01/27/18 0958 97 %     Weight 01/27/18 0958 200 lb (90.7 kg)     Height 01/27/18 0958 5\' 4"  (1.626 m)     Head Circumference --      Peak Flow --      Pain Score 01/27/18 0957 5     Pain Loc --      Pain Edu? --      Excl. in GC? --    No data found.  Updated Vital Signs BP (!)  145/70 (BP Location: Left Arm)   Pulse 73   Temp 97.9 F (36.6 C) (Oral)   Resp 16   Ht 5\' 4"  (1.626 m)   Wt 200 lb (90.7 kg)   SpO2 97%   BMI 34.33 kg/m   Visual Acuity Right Eye Distance:   Left Eye Distance:   Bilateral Distance:    Right Eye Near:   Left Eye Near:    Bilateral Near:     Physical Exam  Constitutional: She is oriented to person, place, and time. She appears well-developed and well-nourished. No distress.  HENT:  Head: Normocephalic.  Eyes: Pupils are equal, round, and reactive to light.  Neck: Normal range of motion.  Pulmonary/Chest: Effort normal and breath sounds normal.  Abdominal: Soft. Bowel sounds are normal.  No CVA tenderness  Musculoskeletal: Normal range of motion.  Neurological: She is alert and oriented to person, place, and time.  Skin: Skin is warm and dry. She is not diaphoretic.  Psychiatric: She has a normal mood and affect. Her behavior is normal. Judgment and thought content normal.  Nursing note and vitals reviewed.    UC Treatments / Results  Labs (all labs ordered are listed, but only abnormal results are displayed) Labs Reviewed  URINALYSIS, COMPLETE (UACMP) WITH MICROSCOPIC - Abnormal; Notable for the following components:      Result Value   Color, Urine STRAW (*)    Hgb urine dipstick TRACE (*)    Leukocytes, UA TRACE (*)    All other components within normal limits  URINE CULTURE    EKG None  Radiology No results found.  Procedures Procedures (including critical care time)  Medications Ordered in UC Medications - No data to display  Initial Impression / Assessment and Plan / UC Course  I have reviewed the triage vital signs and the nursing notes.  Pertinent labs & imaging results that were available during my care of the patient were reviewed by me and considered in my medical decision making (see chart for details).     The patient's urinalysis is not overly as of for UTI but with her symptoms with  the white count I believe that we will treat her empirically until cultures have been resulted.  Urged her to increase her fluid intake.  She has any increase in symptoms such as fever nausea vomiting or high mid back pain he should return to our clinic or go to the emergency department.  Cultures and sensitivities will be available in 48 hours Final Clinical Impressions(s) /  UC Diagnoses   Final diagnoses:  Lower urinary tract infectious disease   Discharge Instructions   None    ED Prescriptions    Medication Sig Dispense Auth. Provider   cephALEXin (KEFLEX) 500 MG capsule Take 1 capsule (500 mg total) by mouth 2 (two) times daily. 10 capsule Lutricia Feil, PA-C     Controlled Substance Prescriptions  Controlled Substance Registry consulted? Not Applicable   Lutricia Feil, PA-C 01/27/18 1054    Lutricia Feil, PA-C 01/27/18 1055

## 2018-01-27 NOTE — ED Triage Notes (Signed)
Patient in today c/o dysuria, bladder pressure and urinary frequency x 4 days. Patient has felt feverish, but hasn't taken her temperature.

## 2018-01-28 LAB — URINE CULTURE: Culture: NO GROWTH

## 2018-02-19 ENCOUNTER — Encounter: Payer: Self-pay | Admitting: Family Medicine

## 2018-02-19 ENCOUNTER — Ambulatory Visit: Payer: 59 | Admitting: Family Medicine

## 2018-02-19 VITALS — BP 138/80 | HR 74 | Temp 97.6°F | Ht 64.0 in | Wt 201.6 lb

## 2018-02-19 DIAGNOSIS — N390 Urinary tract infection, site not specified: Secondary | ICD-10-CM

## 2018-02-19 DIAGNOSIS — M25511 Pain in right shoulder: Secondary | ICD-10-CM

## 2018-02-19 DIAGNOSIS — K219 Gastro-esophageal reflux disease without esophagitis: Secondary | ICD-10-CM

## 2018-02-19 DIAGNOSIS — M62838 Other muscle spasm: Secondary | ICD-10-CM | POA: Diagnosis not present

## 2018-02-19 LAB — URINALYSIS, ROUTINE W REFLEX MICROSCOPIC
Bilirubin Urine: NEGATIVE
HGB URINE DIPSTICK: NEGATIVE
Ketones, ur: NEGATIVE
LEUKOCYTES UA: NEGATIVE
NITRITE: NEGATIVE
PH: 5.5 (ref 5.0–8.0)
Specific Gravity, Urine: 1.02 (ref 1.000–1.030)
Total Protein, Urine: NEGATIVE
Urine Glucose: NEGATIVE
Urobilinogen, UA: 0.2 (ref 0.0–1.0)

## 2018-02-19 NOTE — Progress Notes (Signed)
Subjective:    Patient ID: Sandra Russo, female    DOB: [birth year], 62 y.o.   MRN: [id number]  HPI  Patient presents to clinic for 1 week recheck after adding omeprazole to medication regimen to treat epigastric pain.  Patient also is coming to follow-up on R shoulder pain after taking steoird and muscle relaxer.   Patient states the epigastric pain is greatly improved since adding in the omeprazole she will continue taking.  Patient denies any nausea, vomiting or diarrhea.  X-ray of right shoulder did not reveal any acute issues, just some mild arthritis.  Patient states the steroid taper and muscle relaxers are very helpful in reducing pain.  She no longer has that pain in the right shoulder blade area.  Range of motion her shoulder completely intact.  Patient also reports she went to urgent care on 14 October due to a urinary tract infection.  Patient would like urine rechecked to make sure infection is gone and blood has resolved (trace blood seen on UA).  Patient Active Problem List   Diagnosis Date Noted  . Encounter for screening colonoscopy   . Prediabetes 08/17/2017  . Insomnia 08/17/2017  . Visit for preventive health examination 10/05/2014  . S/P hysterectomy 10/04/2014  . S/P laparoscopic cholecystectomy 10/04/2014  . IBS (irritable colon syndrome) 08/31/2014  . Obesity 08/30/2014  . Chest pain 08/30/2014  . Hyperlipidemia with target LDL less than 160 08/21/2011  . Esophageal dysphagia   . Multinodular goiter 08/20/2011  . Acquired lymphedema of leg    Social History   Tobacco Use  . Smoking status: Never Smoker  . Smokeless tobacco: Never Used  Substance Use Topics  . Alcohol use: No   Review of Systems  Constitutional: Negative for chills, fatigue and fever.  HENT: Negative for congestion, ear pain, sinus pain and sore throat.   Eyes: Negative.   Respiratory: Negative for cough, shortness of breath and wheezing.   Cardiovascular: Negative for chest  pain, palpitations and leg swelling.  Gastrointestinal: Negative for abdominal pain, diarrhea, nausea and vomiting.  Genitourinary: Negative for dysuria, frequency and urgency.  Musculoskeletal: Negative for arthralgias and myalgias.  Skin: Negative for color change, pallor and rash.  Neurological: Negative for syncope, light-headedness and headaches.  Psychiatric/Behavioral: The patient is not nervous/anxious.    Objective:   Physical Exam  Constitutional: She is oriented to person, place, and time. She appears well-nourished. No distress.  HENT:  Head: Normocephalic and atraumatic.  Eyes: Conjunctivae and EOM are normal. No scleral icterus.  Neck: Neck supple. No tracheal deviation present.  Cardiovascular: Normal rate, regular rhythm and normal heart sounds.  Pulmonary/Chest: Effort normal and breath sounds normal. No respiratory distress. She has no wheezes. She has no rales.  Abdominal: Soft. Bowel sounds are normal. There is no tenderness.  Musculoskeletal: Normal range of motion. She exhibits no edema, tenderness or deformity.  Neurological: She is alert and oriented to person, place, and time.  Skin: Skin is warm and dry. No erythema. No pallor.  Psychiatric: She has a normal mood and affect. Her behavior is normal.  Nursing note and vitals reviewed.     Vitals:   02/19/18 0808  BP: 138/80  Pulse: 74  Temp: 97.6 F (36.4 C)  SpO2: 96%   Assessment & Plan:   Acute pain of right shoulder/muscle spasm - right shoulder pain muscle spasms have resolved.  Patient advised that if they reoccur in the future she can take anti-inflammatory medicine such as ibuprofen  and do range of motion exercises.  GERD - acid reflux and epigastric pain symptoms greatly improved with addition of omeprazole.  Patient will continue omeprazole.  Lower UTI -- We will recheck urine to be sure blood has resolved after UTI treatment.   Keep regularly scheduled appt with PCP as planned. Return to  clinic sooner if issues arise.

## 2018-07-29 ENCOUNTER — Telehealth: Payer: Self-pay | Admitting: Internal Medicine

## 2018-07-29 DIAGNOSIS — R300 Dysuria: Secondary | ICD-10-CM

## 2018-07-29 NOTE — Telephone Encounter (Signed)
Pt called and she is sure she has a UTI. She is requesting a antibotic.

## 2018-07-29 NOTE — Telephone Encounter (Signed)
Spoke with pt and she is going to come by the lab in the morning to leave a urine and then she is scheduled for a doxy.me tomorrow at 3:30pm.

## 2018-07-29 NOTE — Telephone Encounter (Signed)
Thanks for ordering! 

## 2018-07-30 ENCOUNTER — Other Ambulatory Visit (INDEPENDENT_AMBULATORY_CARE_PROVIDER_SITE_OTHER): Payer: 59

## 2018-07-30 ENCOUNTER — Ambulatory Visit (INDEPENDENT_AMBULATORY_CARE_PROVIDER_SITE_OTHER): Payer: 59 | Admitting: Internal Medicine

## 2018-07-30 ENCOUNTER — Other Ambulatory Visit: Payer: Self-pay

## 2018-07-30 DIAGNOSIS — R300 Dysuria: Secondary | ICD-10-CM

## 2018-07-30 DIAGNOSIS — R102 Pelvic and perineal pain: Secondary | ICD-10-CM

## 2018-07-30 LAB — URINALYSIS
Bilirubin Urine: NEGATIVE
Hgb urine dipstick: NEGATIVE
Ketones, ur: NEGATIVE
Leukocytes,Ua: NEGATIVE
Nitrite: NEGATIVE
Specific Gravity, Urine: 1.02 (ref 1.000–1.030)
Total Protein, Urine: NEGATIVE
Urine Glucose: NEGATIVE
Urobilinogen, UA: 0.2 (ref 0.0–1.0)
pH: 5 (ref 5.0–8.0)

## 2018-07-30 LAB — URINALYSIS, MICROSCOPIC ONLY

## 2018-07-30 NOTE — Progress Notes (Signed)
Virtual Visit via Telephone Note  This visit type was conducted due to national recommendations for restrictions regarding the COVID-19 pandemic (e.g. social distancing).  This format is felt to be most appropriate for this patient at this time.  All issues noted in this document were discussed and addressed.  No physical exam was performed (except for noted visual exam findings with Video Visits).   I connected with@ on 07/30/18 at  3:30 PM EDT by a video enabled telemedicine application or telephone and verified that I am speaking with the correct person using two identifiers. Location patient: home Location provider: work or home office Persons participating in the virtual visit: patient, provider  I discussed the limitations, risks, security and privacy concerns of performing an evaluation and management service by telephone and the availability of in person appointments. I also discussed with the patient that there may be a patient responsible charge related to this service. The patient expressed understanding and agreed to proceed.  Interactive audio and video telecommunications were attempted between this provider and patient, however failed, due to patient having technical difficulties OR patient did not have access to video capability.  We continued and completed visit with audio only.  Reason for visit: treatment of UTI  HPI:  Patient reports subacute development of and suprapubic cramping and pressure  Accompanied by strong odor of urine which started last week.  She acknowledges that she will often drink less than 16 ounces of water during a 12 hour period due to getting busy at work,  So she increased her water intake significantly and added cranberry juice.  Her  symptoms have improved, and she has also  Been taking an OTC analgesic (AZO) recommended by her pharmacist.   Last eval and treatment of UTI was Oct 2019  Treated in ER  urine culture was  negative ), evaluation was   repeated in Nov 2019  And also negative .  She has had a hysterectomy, is post menopausal, and has not changed sexual partners.  Does not douche or use vaginal estrogen . Denies flank pain , nausea,  Chills and fevers.    ROS: See pertinent positives and negatives per HPI.  Past Medical History:  Diagnosis Date  . Acquired lymphedema of leg    right leg  . Dysphagia    treated with esophgeal dilation at [facility]  . Esophageal dysphagia   . multinodular goiter     Past Surgical History:  Procedure Laterality Date  . ABDOMINAL HYSTERECTOMY  1984  . CHOLECYSTECTOMY  Oct 2013   [facility]  . COLONOSCOPY WITH PROPOFOL N/A 09/19/2017   Procedure: COLONOSCOPY WITH PROPOFOL;  Surgeon: Toney Reil, MD;  Location: [facility] ENDOSCOPY;  Service: Gastroenterology;  Laterality: N/A;    Family History  Problem Relation Age of Onset  . Hypertension Mother   . Hypertension Father     SOCIAL HX: hospital employee    Current Outpatient Medications:  .  omeprazole (PRILOSEC) 20 MG capsule, Take 1 capsule (20 mg total) by mouth daily., Disp: 30 capsule, Rfl: 3 .  traZODone (DESYREL) 50 MG tablet, Take 0.5-1 tablets (25-50 mg total) by mouth at bedtime as needed for sleep., Disp: 30 tablet, Rfl: 3  EXAM:  VITALS per patient if applicable:  General impression: alert, cooperative and articulate.  No signs of being in distress  Lungs: not short of breath ,  No cough, speaking in full sentences  Psych: affect normal,dspeech is articulate and non pressured .  Denies suicidal thoughts  PSYCH/NEURO: pleasant and cooperative, no obvious depression or anxiety, speech and thought processing grossly intact  ASSESSMENT AND PLAN:  Discussed the following assessment and plan:  Abdominal pain, suprapubic Currently resolved,  With no evidence of UTI or even inflammation  by formal UA .  Encouraged to increase water intake to 60 ounces daily,  Continue cranberry tablets or juice daily.  If urine culture  grows a bladder pathogen will treat. If not,  Will offer vaginal estrogen    Updated Medication List Outpatient Encounter Medications as of 07/30/2018  Medication Sig  . omeprazole (PRILOSEC) 20 MG capsule Take 1 capsule (20 mg total) by mouth daily.  . traZODone (DESYREL) 50 MG tablet Take 0.5-1 tablets (25-50 mg total) by mouth at bedtime as needed for sleep.   No facility-administered encounter medications on file as of 07/30/2018.      I discussed the assessment and treatment plan with the patient. The patient was provided an opportunity to ask questions and all were answered. The patient agreed with the plan and demonstrated an understanding of the instructions.   The patient was advised to call back or seek an in-person evaluation if the symptoms worsen or if the condition fails to improve as anticipated.  I provided 15 minutes of non-face-to-face time during this encounter.   Sherlene Shamseresa L Tullo, MD

## 2018-07-30 NOTE — Addendum Note (Signed)
Addended by: Warden Fillers on: 07/30/2018 03:53 PM   Modules accepted: Orders

## 2018-07-31 DIAGNOSIS — R102 Pelvic and perineal pain: Secondary | ICD-10-CM | POA: Insufficient documentation

## 2018-07-31 NOTE — Assessment & Plan Note (Signed)
Currently resolved,  With no evidence of UTI or even inflammation  by formal UA .  Encouraged to increase water intake to 60 ounces daily,  Continue cranberry tablets or juice daily.  If urine culture grows a bladder pathogen will treat. If not,  Will offer vaginal estrogen

## 2018-08-01 LAB — URINE CULTURE
MICRO NUMBER:: 397357
SPECIMEN QUALITY:: ADEQUATE

## 2018-08-03 ENCOUNTER — Other Ambulatory Visit: Payer: Self-pay | Admitting: Internal Medicine

## 2018-08-03 MED ORDER — CIPROFLOXACIN HCL 250 MG PO TABS: 250.0000 mg | ORAL_TABLET | Freq: Two times a day (BID) | ORAL | 0 refills | Status: AC

## 2018-08-05 ENCOUNTER — Telehealth: Payer: Self-pay | Admitting: Internal Medicine

## 2018-08-05 NOTE — Telephone Encounter (Signed)
Copied from CRM [id number]. Topic: Quick Communication - See Telephone Encounter >> Aug 05, 2018  3:30 PM Sandra Russo wrote: CRM for notification. See Telephone encounter for: 08/05/18.  Patient is calling to get her lab results form 4/15. Patient was unsure if she needed any meds. Thanks

## 2018-08-06 NOTE — Telephone Encounter (Signed)
She was sent a mychart message and an antibiotic was called in to her pharmacy over the weekend .  She read the note on April 20th, please confirm that she understood the note that is in the chart

## 2018-08-06 NOTE — Telephone Encounter (Signed)
Pt is requesting urine culture results from 07/30/2018.

## 2018-08-06 NOTE — Telephone Encounter (Signed)
Spoke with pt and she stated that she did not read her [employer]. I read the mychart message to the pt and she stated that she will start the medication in the morning when she gets it picked up from the pharmacy.

## 2018-08-06 NOTE — Telephone Encounter (Signed)
See unrouted message  

## 2018-08-18 ENCOUNTER — Encounter: Payer: 59 | Admitting: Internal Medicine

## 2018-09-22 ENCOUNTER — Other Ambulatory Visit: Payer: Self-pay

## 2018-09-22 ENCOUNTER — Ambulatory Visit: Payer: Self-pay | Admitting: Internal Medicine

## 2018-09-22 NOTE — Telephone Encounter (Signed)
Pt called in c/o having "palpitations" or irregular heart beats since Friday.    I warm transferred the call lto Santiago Glad in Dr. Lupita Dawn office to be scheduled due to the COVID-19 pandemic.    I let  Santiago Glad know she needed to be seen within 4 hours per the protocol.  I sent my notes to Dr. Lupita Dawn office.    Reason for Disposition . [1] Heart beating very rapidly (e.g., > 140 / minute) AND [2] not present now  (Exception: during exercise)  Answer Assessment - Initial Assessment Questions 1. DESCRIPTION: "Please describe your heart rate or heart beat that you are having" (e.g., fast/slow, regular/irregular, skipped or extra beats, "palpitations")     *  It was 2016 I had this same irregular heart rate.   It did not bother me any more.2. ONSET: "When did it start?" (Minutes, hours or days)      Friday I felt weak, tingling and palpitations.    This morning it happened again.   These last about 45-1 hour.  Pulse 150 on pulse Ox.   It's irregular. 3. DURATION: "How long does it last" (e.g., seconds, minutes, hours)     45-1 hour 4. PATTERN "Does it come and go, or has it been constant since it started?"  "Does it get worse with exertion?"   "Are you feeling it now?"     Intermittent  5. TAP: "Using your hand, can you tap out what you are feeling on a chair or table in front of you, so that I can hear?" (Note: not all patients can do this)       150 on pulse ox 6. HEART RATE: "Can you tell me your heart rate?" "How many beats in 15 seconds?"  (Note: not all patients can do this)       150 7. RECURRENT SYMPTOM: "Have you ever had this before?" If so, ask: "When was the last time?" and "What happened that time?"      2016 8. CAUSE: "What do you think is causing the palpitations?"     My niece passed away  And my brother in law passed very close together.  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   I work at the hospital.     9. CARDIAC HISTORY: "Do you have any history of heart disease?" (e.g., heart  attack, angina, bypass surgery, angioplasty, arrhythmia)      2016 I had irregular heart beats.   It resolved on its own. 10. OTHER SYMPTOMS: "Do you have any other symptoms?" (e.g., dizziness, chest pain, sweating, difficulty breathing)       I feel the irregular heart rate.   Just a tingle in my chest and down into my hand while having the irregular heart beats.    I feel tired when it happens.   I will do the [place]als[place]    11. PREGNANCY: "Is there any chance you are pregnant?" "When was your last menstrual period?"       Not asked due to age  Protocols used: [facility] [place]

## 2018-09-23 ENCOUNTER — Other Ambulatory Visit: Payer: Self-pay

## 2018-09-23 ENCOUNTER — Encounter: Payer: Self-pay | Admitting: Family Medicine

## 2018-09-23 ENCOUNTER — Ambulatory Visit (INDEPENDENT_AMBULATORY_CARE_PROVIDER_SITE_OTHER): Payer: 59

## 2018-09-23 ENCOUNTER — Ambulatory Visit: Payer: 59 | Admitting: Family Medicine

## 2018-09-23 VITALS — BP 138/82 | HR 73 | Temp 98.6°F | Resp 18 | Ht 64.0 in | Wt 184.8 lb

## 2018-09-23 DIAGNOSIS — R002 Palpitations: Secondary | ICD-10-CM

## 2018-09-23 DIAGNOSIS — E559 Vitamin D deficiency, unspecified: Secondary | ICD-10-CM

## 2018-09-23 DIAGNOSIS — E538 Deficiency of other specified B group vitamins: Secondary | ICD-10-CM | POA: Diagnosis not present

## 2018-09-23 LAB — CBC WITH DIFFERENTIAL/PLATELET
Basophils Absolute: 0 10*3/uL (ref 0.0–0.1)
Basophils Relative: 0.2 % (ref 0.0–3.0)
Eosinophils Absolute: 0.1 10*3/uL (ref 0.0–0.7)
Eosinophils Relative: 1.6 % (ref 0.0–5.0)
HCT: 38.9 % (ref 36.0–46.0)
Hemoglobin: 12.8 g/dL (ref 12.0–15.0)
Lymphocytes Relative: 28.4 % (ref 12.0–46.0)
Lymphs Abs: 2 10*3/uL (ref 0.7–4.0)
MCHC: 32.8 g/dL (ref 30.0–36.0)
MCV: 88.6 fl (ref 78.0–100.0)
Monocytes Absolute: 0.4 10*3/uL (ref 0.1–1.0)
Monocytes Relative: 6.4 % (ref 3.0–12.0)
Neutro Abs: 4.4 10*3/uL (ref 1.4–7.7)
Neutrophils Relative %: 63.4 % (ref 43.0–77.0)
Platelets: 331 10*3/uL (ref 150.0–400.0)
RBC: 4.39 Mil/uL (ref 3.87–5.11)
RDW: 14.2 % (ref 11.5–15.5)
WBC: 6.9 10*3/uL (ref 4.0–10.5)

## 2018-09-23 LAB — LIPID PANEL
Cholesterol: 242 mg/dL — ABNORMAL HIGH (ref 0–200)
HDL: 50.9 mg/dL (ref >39.00)
LDL Cholesterol: 169 mg/dL — ABNORMAL HIGH (ref 0–99)
NonHDL: 191.16
Total CHOL/HDL Ratio: 5
Triglycerides: 110 mg/dL (ref 0.0–149.0)
VLDL: 22 mg/dL (ref 0.0–40.0)

## 2018-09-23 LAB — VITAMIN D 25 HYDROXY (VIT D DEFICIENCY, FRACTURES): VITD: 11.67 ng/mL — ABNORMAL LOW (ref 30.00–100.00)

## 2018-09-23 LAB — COMPREHENSIVE METABOLIC PANEL
ALT: 13 U/L (ref 0–35)
AST: 11 U/L (ref 0–37)
Albumin: 4 g/dL (ref 3.5–5.2)
Alkaline Phosphatase: 78 U/L (ref 39–117)
BUN: 7 mg/dL (ref 6–23)
CO2: 30 mEq/L (ref 19–32)
Calcium: 9.3 mg/dL (ref 8.4–10.5)
Chloride: 107 mEq/L (ref 96–112)
Creatinine, Ser: 0.76 mg/dL (ref 0.40–1.20)
GFR: 93.12 mL/min (ref >60.00)
Glucose, Bld: 108 mg/dL — ABNORMAL HIGH (ref 70–99)
Potassium: 4 mEq/L (ref 3.5–5.1)
Sodium: 142 mEq/L (ref 135–145)
Total Bilirubin: 0.5 mg/dL (ref 0.2–1.2)
Total Protein: 7 g/dL (ref 6.0–8.3)

## 2018-09-23 LAB — TSH: TSH: 1.65 u[IU]/mL (ref 0.35–4.50)

## 2018-09-23 LAB — B12 AND FOLATE PANEL
Folate: 5.4 ng/mL — ABNORMAL LOW (ref >5.9)
Vitamin B-12: 150 pg/mL — ABNORMAL LOW (ref 211–911)

## 2018-09-23 MED ORDER — DILTIAZEM HCL 30 MG PO TABS: 30.0000 mg | ORAL_TABLET | Freq: Four times a day (QID) | ORAL | 1 refills | Status: AC | PRN

## 2018-09-23 NOTE — Progress Notes (Signed)
Subjective:    Patient ID: Sandra Russo, female    DOB: 05-05-1955, 63 y.o.   MRN: [id number]  HPI   Patient presents to clinic due to heart palpitations.  States she had an episode on Saturday where she felt her heart racing for about 45 minutes, states she did have a pulse ox available and was able to check her heart rate and that the pulse ox read 150.  Patient states she also felt some pain in chest on the left side.  States after period of time the pain palpitations went away in heart rate and rhythm seem to return to normal.  Patient states something similar to this happened to her about 4 years ago and she did see Dr. Mariah MillingGollan at the cardiology clinic.  States she was prescribed diltiazem to use as needed for episodes of racing heart.  Denies any shortness of breath or wheezing.  Denies cough.  Denies lower extremity swelling.  Patient Active Problem List   Diagnosis Date Noted  . Abdominal pain, suprapubic 07/31/2018  . Encounter for screening colonoscopy   . Prediabetes 08/17/2017  . Insomnia 08/17/2017  . Visit for preventive health examination 10/05/2014  . S/P hysterectomy 10/04/2014  . S/P laparoscopic cholecystectomy 10/04/2014  . IBS (irritable colon syndrome) 08/31/2014  . Obesity 08/30/2014  . Chest pain 08/30/2014  . Hyperlipidemia with target LDL less than 160 08/21/2011  . Esophageal dysphagia   . Multinodular goiter 08/20/2011  . Acquired lymphedema of leg    Social History   Tobacco Use  . Smoking status: Never Smoker  . Smokeless tobacco: Never Used  Substance Use Topics  . Alcohol use: No   Review of Systems  Constitutional: Negative for chills, fatigue and fever.  HENT: Negative for congestion, ear pain, sinus pain and sore throat.   Eyes: Negative.   Respiratory: Negative for cough, shortness of breath and wheezing.   Cardiovascular: +palpitations and chest pain. Negative for leg swelling.  Gastrointestinal: Negative for abdominal pain,  diarrhea, nausea and vomiting.  Genitourinary: Negative for dysuria, frequency and urgency.  Musculoskeletal: Negative for arthralgias and myalgias.  Skin: Negative for color change, pallor and rash.  Neurological: Negative for syncope, light-headedness and headaches.  Psychiatric/Behavioral: The patient is not nervous/anxious.       Objective:   Physical Exam Vitals signs and nursing note reviewed.  Constitutional:      General: She is not in acute distress.    Appearance: She is not toxic-appearing.  HENT:     Head: Normocephalic and atraumatic.  Eyes:     General: No scleral icterus.    Extraocular Movements: Extraocular movements intact.     Conjunctiva/sclera: Conjunctivae normal.     Pupils: Pupils are equal, round, and reactive to light.  Neck:     Musculoskeletal: Normal range of motion and neck supple. No neck rigidity.     Vascular: No carotid bruit.  Cardiovascular:     Rate and Rhythm: Normal rate and regular rhythm.     Pulses: Normal pulses.     Heart sounds: Normal heart sounds. No murmur. No friction rub. No gallop.   Pulmonary:     Effort: Pulmonary effort is normal. No respiratory distress.     Breath sounds: No wheezing, rhonchi or rales.  Chest:     Chest wall: No tenderness.  Musculoskeletal:     Right lower leg: No edema.     Left lower leg: No edema.  Lymphadenopathy:     Cervical:  No cervical adenopathy.  Skin:    General: Skin is warm and dry.     Coloration: Skin is not jaundiced or pale.  Neurological:     General: No focal deficit present.     Mental Status: She is alert and oriented to person, place, and time.  Psychiatric:        Mood and Affect: Mood normal.        Behavior: Behavior normal.        Thought Content: Thought content normal.        Judgment: Judgment normal.    Vitals:   09/23/18 1323  BP: 138/82  Pulse: 73  Resp: 18  Temp: 98.6 F (37 C)  SpO2: 97%      Assessment & Plan:    Heart palpitations - patient's  EKG in office personally reviewed by me shows sinus rhythm at a rate of 67.  No obvious or acute cardiac abnormality seen on the EKG.  Advised patient that she could be going in and out of a tachycardic rhythm.  We will resume her using diltiazem as needed as she did previously and also put in a cardiology referral for further work-up.  We will get blood work in clinic today & also CXR in clinic to further investigate palpitations.   Advised that if she does not hear anything about cardiology referral in the next 7 days to let us know.   Also advised if she develops chest pain again that she must go to emergency room right away for evaluation.

## 2018-09-23 NOTE — Patient Instructions (Signed)
Palpitations  Palpitations are feelings that your heartbeat is not normal. Your heartbeat may feel like it is:   Uneven.   Faster than normal.   Fluttering.   Skipping a beat.  This is usually not a serious problem. In some cases, you may need tests to rule out any serious problems.  Follow these instructions at home:  Pay attention to any changes in your condition. Take these actions to help manage your symptoms:  Eating and drinking   Avoid:  ? Coffee, tea, soft drinks, and energy drinks.  ? Chocolate.  ? Alcohol.  ? Diet pills.  Lifestyle     Try to lower your stress. These things can help you relax:  ? Yoga.  ? Deep breathing and meditation.  ? Exercise.  ? Using words and images to create positive thoughts (guided imagery).  ? Using your mind to control things in your body (biofeedback).   Do not use drugs.   Get plenty of rest and sleep. Keep a regular bed time.  General instructions     Take over-the-counter and prescription medicines only as told by your doctor.   Do not use any products that contain nicotine or tobacco, such as cigarettes and e-cigarettes. If you need help quitting, ask your doctor.   Keep all follow-up visits as told by your doctor. This is important. You may need more tests if palpitations do not go away or get worse.  Contact a doctor if:   Your symptoms last more than 24 hours.   Your symptoms occur more often.  Get help right away if you:   Have chest pain.   Feel short of breath.   Have a very bad headache.   Feel dizzy.   Pass out (faint).  Summary   Palpitations are feelings that your heartbeat is uneven or faster than normal. It may feel like your heart is fluttering or skipping a beat.   Avoid food and drinks that may cause palpitations. These include caffeine, chocolate, and alcohol.   Try to lower your stress. Do not smoke or use drugs.   Get help right away if you faint or have chest pain, shortness of breath, a severe headache, or dizziness.  This  information is not intended to replace advice given to you by your health care provider. Make sure you discuss any questions you have with your health care provider.  Document Released: 01/10/2008 Document Revised: 05/15/2017 Document Reviewed: 05/15/2017  Elsevier Interactive Patient Education  2019 [employer] Inc[employer]

## 2018-09-24 ENCOUNTER — Encounter: Payer: 59 | Admitting: Internal Medicine

## 2018-09-24 MED ORDER — VITAMIN B-12 1000 MCG PO TABS: 1000.0000 ug | ORAL_TABLET | Freq: Every day | ORAL | 2 refills | Status: AC

## 2018-09-24 MED ORDER — VITAMIN D (ERGOCALCIFEROL) 1.25 MG (50000 UNIT) PO CAPS: 50000.0000 [IU] | ORAL_CAPSULE | ORAL | 0 refills | Status: AC

## 2018-09-24 MED ORDER — FOLIC ACID 400 MCG PO TABS: 400.0000 ug | ORAL_TABLET | Freq: Every day | ORAL | 2 refills | Status: AC

## 2018-09-24 NOTE — Addendum Note (Signed)
Addended by: Philis Nettle on: 09/24/2018 08:04 AM   Modules accepted: Orders

## 2018-11-05 ENCOUNTER — Encounter: Payer: 59 | Admitting: Internal Medicine

## 2021-05-01 IMAGING — DX CHEST - 2 VIEW
2 series · 2 of 2 positions shown · non-contrast
Comparison: None.

CLINICAL DATA: Palpitations.

EXAM:
CHEST - 2 VIEW

[chest pa]
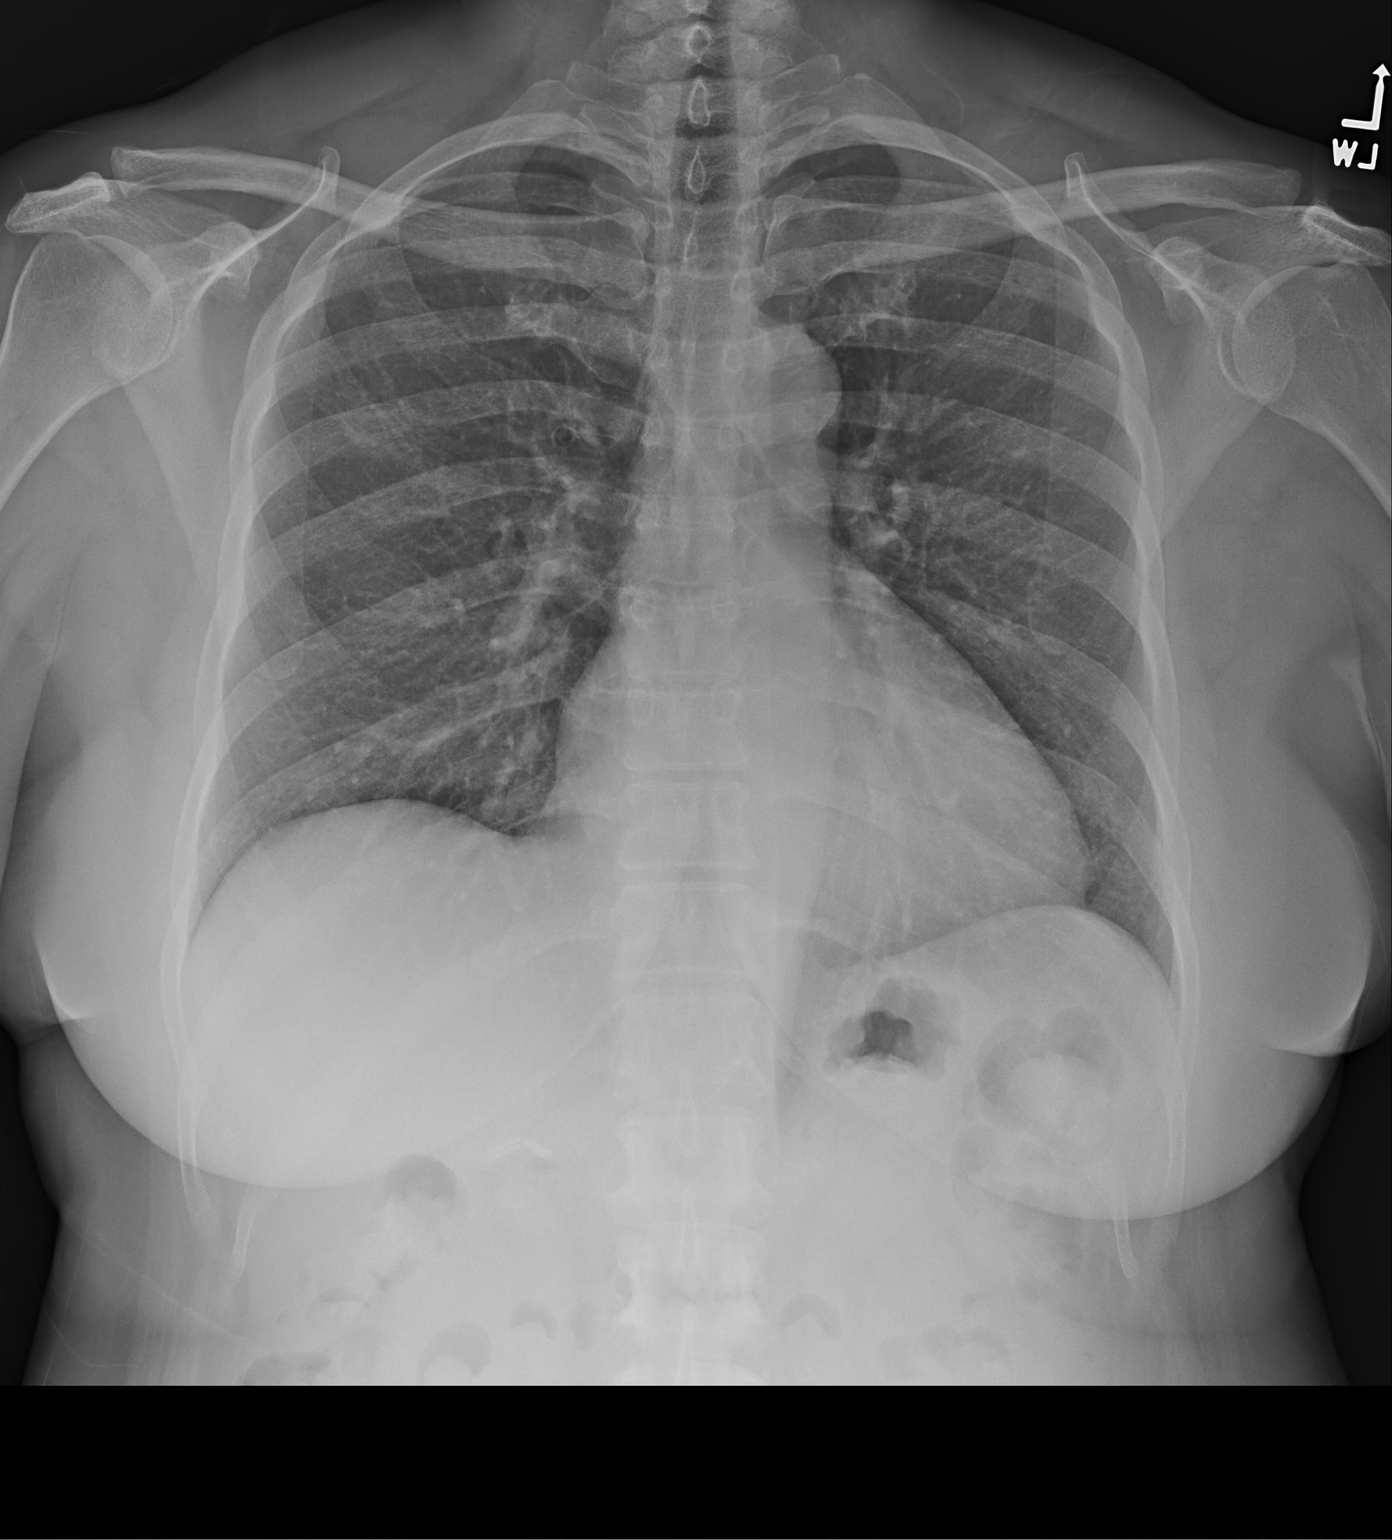

[chest lat]
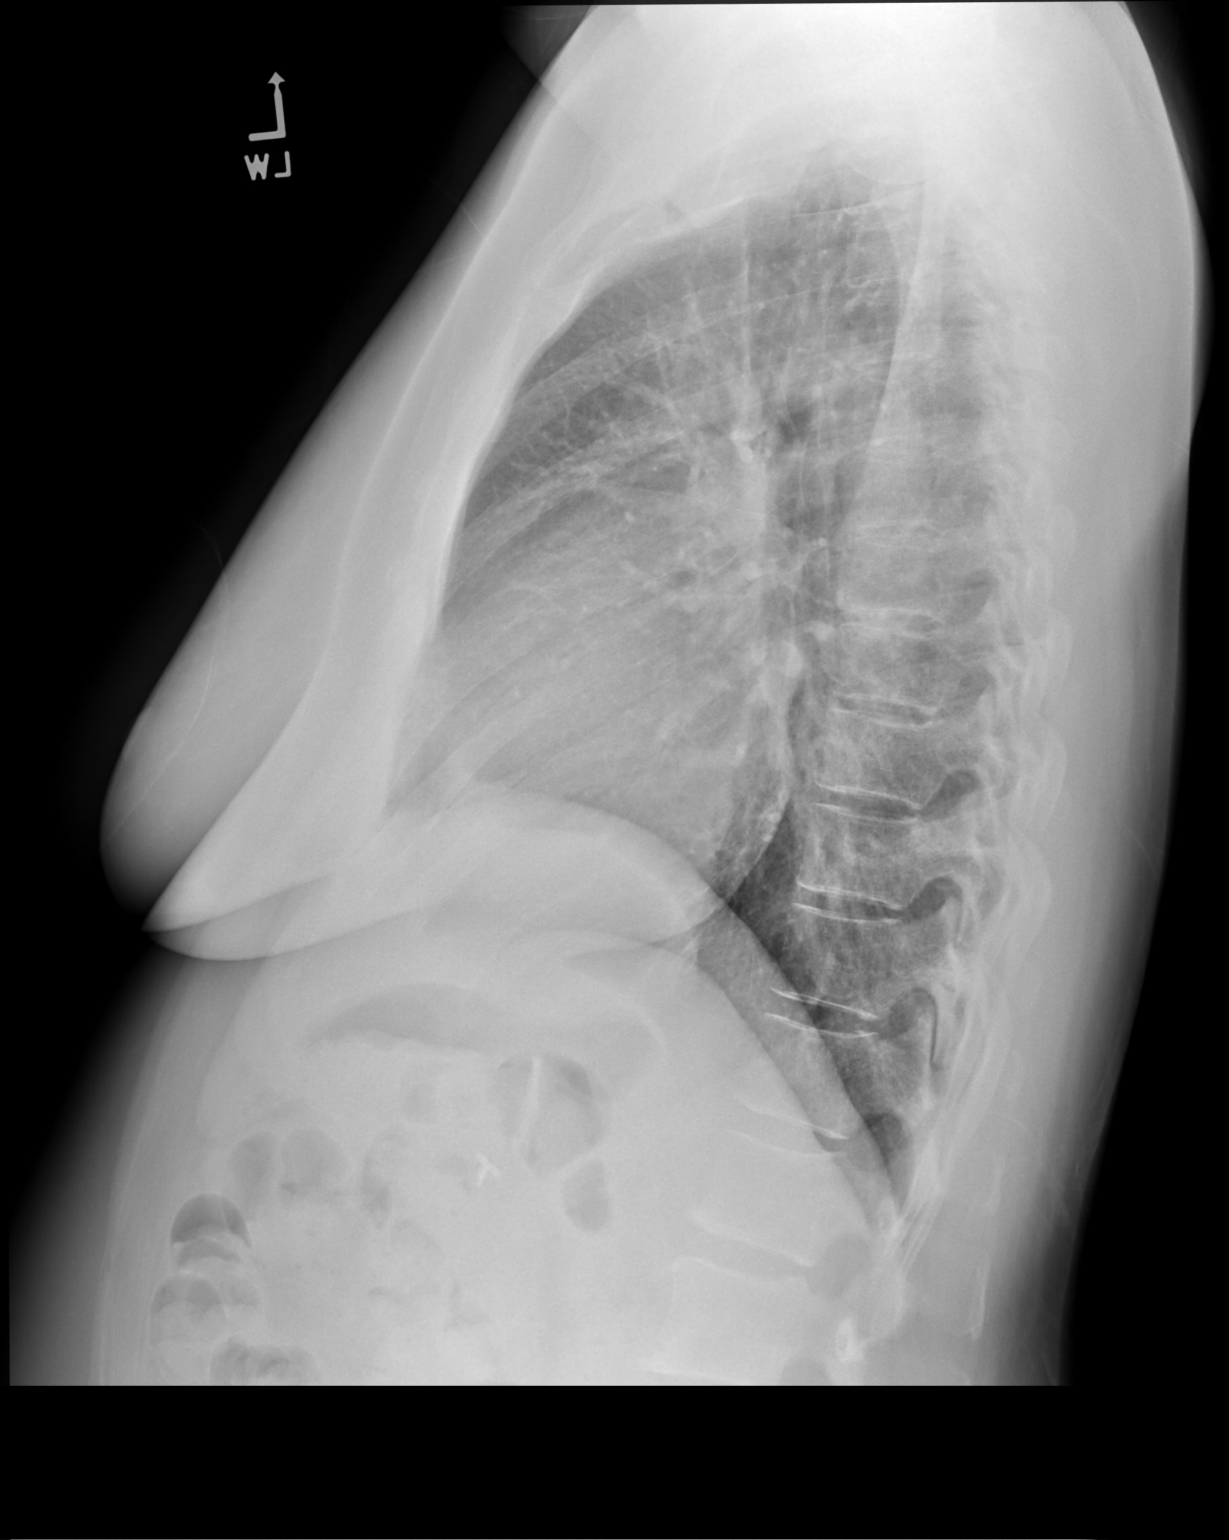

[2 of 2 positions shown; findings below may reference images not displayed]

FINDINGS: The heart size and mediastinal contours are within normal limits.
Both lungs are clear. The visualized skeletal structures are
unremarkable.
IMPRESSION: No active cardiopulmonary disease.

## 2022-06-20 LAB — HM MAMMOGRAPHY
